# Patient Record
Sex: Male | Born: 1955 | Race: Asian | Hispanic: No | Marital: Married | Smoking: Current every day smoker
Health system: Southern US, Community
[De-identification: ages and names within clinical notes are randomized; demographics above are authoritative.]

## PROBLEM LIST (undated history)

## (undated) DIAGNOSIS — M1990 Unspecified osteoarthritis, unspecified site: Secondary | ICD-10-CM

## (undated) DIAGNOSIS — I10 Essential (primary) hypertension: Secondary | ICD-10-CM

## (undated) DIAGNOSIS — I739 Peripheral vascular disease, unspecified: Secondary | ICD-10-CM

---
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
## 2014-04-08 ENCOUNTER — Ambulatory Visit (INDEPENDENT_AMBULATORY_CARE_PROVIDER_SITE_OTHER): Payer: BC Managed Care – PPO | Admitting: Family

## 2014-04-08 ENCOUNTER — Ambulatory Visit (INDEPENDENT_AMBULATORY_CARE_PROVIDER_SITE_OTHER)
Admission: RE | Admit: 2014-04-08 | Discharge: 2014-04-08 | Disposition: A | Discharge status: Home / Self Care | Payer: BC Managed Care – PPO | Source: Ambulatory Visit | Attending: Family | Admitting: Family

## 2014-04-08 ENCOUNTER — Encounter: Payer: Self-pay | Admitting: Family

## 2014-04-08 ENCOUNTER — Telehealth: Payer: Self-pay | Admitting: Family

## 2014-04-08 VITALS — BP 170/94 | HR 104 | Temp 97.8°F | Resp 18 | Ht 63.0 in | Wt 105.0 lb

## 2014-04-08 DIAGNOSIS — M25551 Pain in right hip: Secondary | ICD-10-CM

## 2014-04-08 DIAGNOSIS — Z720 Tobacco use: Secondary | ICD-10-CM

## 2014-04-08 DIAGNOSIS — M1611 Unilateral primary osteoarthritis, right hip: Secondary | ICD-10-CM | POA: Insufficient documentation

## 2014-04-08 DIAGNOSIS — F17200 Nicotine dependence, unspecified, uncomplicated: Secondary | ICD-10-CM

## 2014-04-08 MED ORDER — METHYLPREDNISOLONE (PAK) 4 MG PO TABS: ORAL_TABLET | ORAL | Status: DC

## 2014-04-08 NOTE — Patient Instructions (Signed)
Thank you for choosing [facility].  Summary/Instructions:   Please stop by radiology for an x-ray of the right hip  Your medication has been sent to your pharmacy  If you do not experience relief with the steroid, please let us know.   Please make a follow up for 2 weeks for a blood pressure check.

## 2014-04-08 NOTE — Progress Notes (Signed)
Pre visit review using our clinic review tool, if applicable. No additional management support is needed unless otherwise documented below in the visit note. 

## 2014-04-08 NOTE — Assessment & Plan Note (Signed)
Currently smoking 1 pack per day and has no intentions of stopping now.

## 2014-04-08 NOTE — Telephone Encounter (Signed)
Please call patient to tell him that there were no fractures on his x-ray. However he does have significant osteoarthritic changes to the hip. I would like to send him to the orthopedic surgeons for further management. Please determine if he would like to go back to [facility].

## 2014-04-08 NOTE — Assessment & Plan Note (Signed)
Question if pain is related to sciatica. No obvious mechanism present. Obtain x-ray to rule out fracture despite denial of trauma. Start medrol dose pack to decrease inflammation. If symptoms do not improve with medrol dose pack will consider referral to orthopedics.

## 2014-04-08 NOTE — Progress Notes (Signed)
   Subjective:    Patient ID: Derek Dimmerhong T Rollins, male    DOB: August 08, 1955, 58 y.o.   MRN: [id number]  Chief Complaint  Patient presents with  . Establish Care    Has had hip issues x4 months, still in pain    HPI:  Derek Rollins is a 58 y.o. male who presents today to establish care and discuss a hip issue.   1) Hip pain - This is a chronic condition that started about 5 months ago. Quality of hip pain is described as sharp. Indicates everything is dependent upon his left leg and is currently walking with a cane. Denies any treatments that he has tried to make it better. Movement or when carrying something make it worse. When he massages his right hip it feels better. Denies trauma or injury to either hip.    2) Blood pressure - Hasn't been to a doctor in many years. Has not had any previously high blood pressure readings.   No Known Allergies  No current outpatient prescriptions on file prior to visit.   No current facility-administered medications on file prior to visit.   History reviewed. No pertinent past medical history.  Past Surgical History  Procedure Laterality Date  . Lumbar disc surgery      Review of Systems    See HPI Objective:    BP 170/94  Pulse 104  Temp(Src) 97.8 F (36.6 C) (Oral)  Resp 18  Ht 5\' 3"  (1.6 m)  Wt 105 lb (47.628 kg)  BMI 18.60 kg/m2  SpO2 96% Nursing note and vital signs reviewed.  Physical Exam  Constitutional: He is oriented to person, place, and time.  Thin male who walks with a cane. Appears older than his stated age, dressed appropriately in NAD.   Cardiovascular: Normal rate, regular rhythm and normal heart sounds.   Pulmonary/Chest: Effort normal and breath sounds normal.  Musculoskeletal:  No obvious deformity, discoloration or edema of right hip. Palpable tenderness over right greater trochanter and around ischial tuberosity. ROM is decreased in internal and external rotation. Able to flex without problem.   Neurological: He is  alert and oriented to person, place, and time.  Skin: Skin is warm and dry.  Psychiatric: He has a normal mood and affect. His behavior is normal. Judgment and thought content normal.      Assessment & Plan:

## 2014-04-09 ENCOUNTER — Telehealth: Payer: Self-pay | Admitting: Family

## 2014-04-09 NOTE — Telephone Encounter (Signed)
Sending results by mail

## 2014-04-09 NOTE — Telephone Encounter (Signed)
Tried calling pt to let him know results of xray. The number was disconnected.

## 2014-04-09 NOTE — Telephone Encounter (Signed)
emmi mailed  °

## 2014-04-22 ENCOUNTER — Other Ambulatory Visit (INDEPENDENT_AMBULATORY_CARE_PROVIDER_SITE_OTHER): Payer: BC Managed Care – PPO

## 2014-04-22 ENCOUNTER — Ambulatory Visit (INDEPENDENT_AMBULATORY_CARE_PROVIDER_SITE_OTHER): Payer: BC Managed Care – PPO | Admitting: Family

## 2014-04-22 ENCOUNTER — Encounter: Payer: Self-pay | Admitting: Family

## 2014-04-22 ENCOUNTER — Telehealth: Payer: Self-pay | Admitting: Family

## 2014-04-22 VITALS — BP 182/108 | HR 86 | Temp 97.1°F | Resp 18 | Ht 63.0 in | Wt 104.8 lb

## 2014-04-22 DIAGNOSIS — I10 Essential (primary) hypertension: Secondary | ICD-10-CM

## 2014-04-22 DIAGNOSIS — M1611 Unilateral primary osteoarthritis, right hip: Secondary | ICD-10-CM

## 2014-04-22 LAB — LIPID PANEL
Cholesterol: 166 mg/dL (ref 0–200)
HDL: 62.3 mg/dL (ref >39.00)
LDL CALC: 70 mg/dL (ref 0–99)
NONHDL: 103.7
Total CHOL/HDL Ratio: 3
Triglycerides: 168 mg/dL — ABNORMAL HIGH (ref 0.0–149.0)
VLDL: 33.6 mg/dL (ref 0.0–40.0)

## 2014-04-22 LAB — CBC
HEMATOCRIT: 49.5 % (ref 39.0–52.0)
HEMOGLOBIN: 16.3 g/dL (ref 13.0–17.0)
MCHC: 32.9 g/dL (ref 30.0–36.0)
MCV: 88.2 fl (ref 78.0–100.0)
Platelets: 255 10*3/uL (ref 150.0–400.0)
RBC: 5.61 Mil/uL (ref 4.22–5.81)
RDW: 13.5 % (ref 11.5–15.5)
WBC: 13.3 10*3/uL — ABNORMAL HIGH (ref 4.0–10.5)

## 2014-04-22 LAB — BASIC METABOLIC PANEL
BUN: 11 mg/dL (ref 6–23)
CHLORIDE: 99 meq/L (ref 96–112)
CO2: 27 meq/L (ref 19–32)
Calcium: 8.9 mg/dL (ref 8.4–10.5)
Creatinine, Ser: 0.8 mg/dL (ref 0.4–1.5)
GFR: 100.08 mL/min (ref >60.00)
Glucose, Bld: 83 mg/dL (ref 70–99)
POTASSIUM: 4.1 meq/L (ref 3.5–5.1)
Sodium: 133 mEq/L — ABNORMAL LOW (ref 135–145)

## 2014-04-22 MED ORDER — AMLODIPINE BESYLATE 5 MG PO TABS
5.0000 mg | ORAL_TABLET | Freq: Every day | ORAL | Status: DC
Start: 2014-04-22 — End: 2014-05-21

## 2014-04-22 MED ORDER — MELOXICAM 15 MG PO TABS: 15.0000 mg | ORAL_TABLET | Freq: Every day | ORAL | Status: DC

## 2014-04-22 NOTE — Assessment & Plan Note (Addendum)
Has had multiple high blood pressure readings above the goal of 140/90. Explained to patient need to start antihypertensive medications. Start amlodipine. Educated regarding the side effects of lower leg edema. Discussed importance of reducing and quitting smoking to assist with achieving this goal. Follow up in 1 month.

## 2014-04-22 NOTE — Progress Notes (Signed)
   Subjective:    Patient ID: Derek Rollins, male    DOB: Nov 01, 1955, 58 y.o.   MRN: [id number]  Chief Complaint  Patient presents with  . Follow-up    still having leg pain    HPI:  Derek Rollins is a 58 y.o. male who presents today for follow up of right hip pain. His translator Jamelle HaringSnow is present for the visit with his permission and for interpretation.  1) Osteoarthritis of Right Hip - Reviewed the results of his x-ray which showed osteoarthritic changes to the right hip. Was recently prescribed a medrol dose pack which he said helped the pain some but the pain has returned since stopping the medication. He continues to walk with a cane as needed.  2) Blood pressure - Continues to remain high follow multiple readings. Currently not taking any medication for BP control. Denies chest pain/discomfort, edema, shortness of breath or palpitations. Continues to smoke.   BP Readings from Last 3 Encounters:  04/22/14 182/108  04/08/14 170/94   No Known Allergies  Current Outpatient Prescriptions on File Prior to Visit  Medication Sig Dispense Refill  . methylPREDNIsolone (MEDROL DOSPACK) 4 MG tablet follow package directions 21 tablet 0   No current facility-administered medications on file prior to visit.   Review of Systems    See HPI  Objective:    BP 182/108 mmHg  Pulse 86  Temp(Src) 97.1 F (36.2 C) (Oral)  Resp 18  Ht 5\' 3"  (1.6 m)  Wt 104 lb 12.8 oz (47.537 kg)  BMI 18.57 kg/m2  SpO2 97% Nursing note and vital signs reviewed. Retake BP 160 / 92  Physical Exam  Constitutional: He is oriented to person, place, and time. He appears well-developed and well-nourished. No distress.  Thin male who appears older than his stated age, seated in the chair with cane next to him. Dressed appropriately for this situation.   Cardiovascular: Normal rate, regular rhythm, normal heart sounds and intact distal pulses.   Pulmonary/Chest: Effort normal and breath sounds normal.    Musculoskeletal:  Tenderness of greater trochanter and slightly inferior to it.   Neurological: He is alert and oriented to person, place, and time.  Skin: Skin is warm and dry.  Psychiatric: He has a normal mood and affect. His behavior is normal. Judgment and thought content normal.       Assessment & Plan:

## 2014-04-22 NOTE — Progress Notes (Signed)
Pre visit review using our clinic review tool, if applicable. No additional management support is needed unless otherwise documented below in the visit note. 

## 2014-04-22 NOTE — Patient Instructions (Addendum)
Thank you for choosing [facility].  Summary/Instructions:   Please stop by the lab for lab work.  Please start taking the amlodipine 1 pill daily for your blood pressure.  Please take the mobic 1 pill daily as needed for the hip pain.  Your referral has been made to orthopedics for management of your hip pain. You should hear back in about a week.

## 2014-04-22 NOTE — Assessment & Plan Note (Signed)
Medrol dose pack decreased pain and inflammation. Returned when medication stopped. Start Mobic as needed for pain. Instructed not to take any additional ibuprofen or Aleve. Refer to orthopedic surgery for potential injections and further management.

## 2014-04-22 NOTE — Telephone Encounter (Signed)
Please call the patient's translator, Jamelle HaringSnow to inform the patient that his lab work has come back mostly normal. Therefore no additional lab work is needed at this time. We will continue to work on bringing down his blood pressure and await referral to orthopedics.

## 2014-04-23 NOTE — Telephone Encounter (Signed)
Notified translator (snow) with Tammy SoursGreg response...Raechel Chute/lmb

## 2014-05-21 ENCOUNTER — Ambulatory Visit (INDEPENDENT_AMBULATORY_CARE_PROVIDER_SITE_OTHER): Payer: BC Managed Care – PPO | Admitting: Family

## 2014-05-21 ENCOUNTER — Encounter: Payer: Self-pay | Admitting: Family

## 2014-05-21 VITALS — BP 150/92 | HR 81 | Temp 98.1°F | Ht 63.0 in | Wt 110.0 lb

## 2014-05-21 DIAGNOSIS — I10 Essential (primary) hypertension: Secondary | ICD-10-CM

## 2014-05-21 DIAGNOSIS — M1611 Unilateral primary osteoarthritis, right hip: Secondary | ICD-10-CM

## 2014-05-21 MED ORDER — MELOXICAM 15 MG PO TABS: 15.0000 mg | ORAL_TABLET | Freq: Every day | ORAL | Status: DC

## 2014-05-21 MED ORDER — AMLODIPINE BESYLATE 5 MG PO TABS: 5.0000 mg | ORAL_TABLET | Freq: Every day | ORAL | Status: DC

## 2014-05-21 NOTE — Assessment & Plan Note (Signed)
Pressure remains stable on 5 mg of amlodipine. Slightly elevated today patient indicates he has not taken his blood pressure medication yet. Continue 5 mg amlodipine.

## 2014-05-21 NOTE — Assessment & Plan Note (Signed)
Patient seen by orthopedics. He is scheduled to have a right total hip replacements in the near future. Follow up as necessary.

## 2014-05-21 NOTE — Patient Instructions (Signed)
Thank you for choosing [facility].  Summary/Instructions:  Your prescription(s) have been submitted to your pharmacy. Please take as directed and contact our office if you believe you are having problem(s) with the medication(s).  Continue to take medications as prescribed.

## 2014-05-21 NOTE — Progress Notes (Signed)
   Subjective:    Patient ID: Derek Rollins, male    DOB: [birth year], 58 y.o.   MRN: [id number]  Chief Complaint  Patient presents with  . Follow-up    HPI:  Derek Rollins is a 58 y.o. male who presents today for follow up.  1) Hypertension - Was recently started on amlodipine. Denies chest pain/discomfort, shortness of breath, or edema.   BP Readings from Last 3 Encounters:  05/21/14 150/92  04/22/14 182/108  04/08/14 170/94   2) Right Hip - was recently seen [facility] and it was determined he would need a right total hip replacement. Continues to take the meloxicam which helps maintain the pain.  No Known Allergies  Current Outpatient Prescriptions  Medication Sig Dispense Refill  . amLODipine (NORVASC) 5 MG tablet Take 1 tablet (5 mg total) by mouth daily. 30 tablet 5  . meloxicam (MOBIC) 15 MG tablet Take 1 tablet (15 mg total) by mouth daily. 30 tablet 2  . methylPREDNIsolone (MEDROL DOSPACK) 4 MG tablet follow package directions 21 tablet 0   No current facility-administered medications for this visit.    Review of Systems   See HPI    Objective:    BP 150/92 mmHg  Pulse 81  Temp(Src) 98.1 F (36.7 C) (Oral)  Ht 5\' 3"  (1.6 m)  Wt 110 lb (49.896 kg)  BMI 19.49 kg/m2  SpO2 97% Nursing note and vital signs reviewed.  Physical Exam  Constitutional: He is oriented to person, place, and time. He appears well-developed and well-nourished. No distress.  Pleasant male seated in the chair dressed appropriately and appears slightly older than his stated age  Cardiovascular: Normal rate, regular rhythm, normal heart sounds and intact distal pulses.   Pulmonary/Chest: Effort normal and breath sounds normal.  Neurological: He is alert and oriented to person, place, and time.  Skin: Skin is warm and dry.  Psychiatric: He has a normal mood and affect. His behavior is normal. Judgment and thought content normal.       Assessment & Plan:

## 2014-05-24 ENCOUNTER — Telehealth: Payer: Self-pay | Admitting: Family

## 2014-05-24 NOTE — Telephone Encounter (Signed)
emmi mailed  °

## 2014-06-10 ENCOUNTER — Telehealth: Payer: Self-pay

## 2014-06-10 NOTE — Telephone Encounter (Signed)
Pts translator called asking about getting pt medical clearance for [facility]. I called the translator back and left a message for her to call me back. I also stated in the message that in order for pt to get the medical clearance, he would need to be seen by Tammy SoursGreg or any provider here.

## 2014-06-16 ENCOUNTER — Encounter: Payer: Self-pay | Admitting: Family

## 2014-06-16 ENCOUNTER — Ambulatory Visit (INDEPENDENT_AMBULATORY_CARE_PROVIDER_SITE_OTHER): Payer: Self-pay | Admitting: Family

## 2014-06-16 ENCOUNTER — Ambulatory Visit: Payer: Self-pay | Admitting: Family

## 2014-06-16 VITALS — BP 138/82 | HR 77 | Temp 98.0°F | Resp 18 | Wt 109.0 lb

## 2014-06-16 DIAGNOSIS — I10 Essential (primary) hypertension: Secondary | ICD-10-CM

## 2014-06-16 DIAGNOSIS — Z01818 Encounter for other preprocedural examination: Secondary | ICD-10-CM | POA: Insufficient documentation

## 2014-06-16 NOTE — Assessment & Plan Note (Signed)
Stable on 5 mg of amlodipine. Continue current dosage as prescribed.

## 2014-06-16 NOTE — Progress Notes (Signed)
Subjective:    Patient ID: Derek Rollins, male    DOB: 1955/08/03, 59 y.o.   MRN: [id number]   Chief Complaint  Patient presents with  . Surgical Clearance    Hip surgery, [facility] said they already faxed paperwork over for clearance    HPI:  [name] is a 59 y.o. male who presents today for an exam for surgical clearance.  Patient reports today at the request of [facility] to clear him for his upcoming right total hip replacement. X-rays revealed significant osteoarthritic changes which is the need for surgery.  Problem List  1) Hypertension - currently stable and maintained on amlodipine.   2) Right hip pain - currently maintained on meloxicam as needed for pain control  No Known Allergies   Current Outpatient Prescriptions on File Prior to Visit  Medication Sig Dispense Refill  . amLODipine (NORVASC) 5 MG tablet Take 1 tablet (5 mg total) by mouth daily. 30 tablet 5  . meloxicam (MOBIC) 15 MG tablet Take 1 tablet (15 mg total) by mouth daily. 30 tablet 2   No current facility-administered medications on file prior to visit.    No past medical history on file.  Past Surgical History  Procedure Laterality Date  . Lumbar disc surgery      No family history on file.   History   Social History  . Marital Status: Married    Spouse Name: N/A    Number of Children: 2  . Years of Education: 12   Occupational History  . Unemployed      Working on [employer] disability   Social History Main Topics  . Smoking status: Current Every Day Smoker -- 1.00 packs/day for 32 years    Types: Cigarettes  . Smokeless tobacco: Never Used  . Alcohol Use: Not on file     Comment: Occasional  . Drug Use: No  . Sexual Activity: Not on file   Other Topics Concern  . Not on file   Social History Narrative   Born and raised in [place]. Currently lives in a house - wife and children. No pets. Fun: Can't do much now - sing karoke.   Denies religious beliefs  effecting health care.     Review of Systems  Constitutional: Denies fever, chills, fatigue, or significant weight gain/loss. HENT: Head: Denies headache or neck pain Ears: Denies changes in hearing, ringing in ears, earache, drainage Nose: Denies discharge, stuffiness, itching, nosebleed, sinus pain Throat: Denies sore throat, hoarseness, dry mouth, sores, thrush Eyes: Denies loss/changes in vision, pain, redness, blurry/double vision, flashing lights Cardiovascular: Denies chest pain/discomfort, tightness, palpitations, shortness of breath with activity, difficulty lying down, swelling, sudden awakening with shortness of breath Respiratory: Denies shortness of breath, cough, sputum production, wheezing Gastrointestinal: Denies dysphasia, heartburn, change in appetite, nausea, change in bowel habits, rectal bleeding, constipation, diarrhea, yellow skin or eyes Genitourinary: Denies frequency, urgency, burning/pain, blood in urine, incontinence, change in urinary strength. Musculoskeletal: Denies muscle/joint pain (other than described below), stiffness, back pain, redness or swelling of joints, trauma Right hip pain secondary to osteoarthritic changes Skin: Denies rashes, lumps, itching, dryness, color changes, or hair/nail changes Neurological: Denies dizziness, fainting, seizures, weakness, numbness, tingling, tremor Psychiatric - Denies nervousness, stress, depression or memory loss Endocrine: Denies heat or cold intolerance, sweating, frequent urination, excessive thirst, changes in appetite Hematologic: Denies ease of bruising or bleeding      Objective:    BP 138/82 mmHg  Pulse 77  Temp(Src)  98 F (36.7 C) (Oral)  Resp 18  Wt 109 lb (49.442 kg)  SpO2 98% Nursing note and vital signs reviewed.  Physical Exam  Constitutional: He is oriented to person, place, and time. He appears well-developed and well-nourished.  HENT:  Head: Normocephalic.  Right Ear: Hearing, tympanic  membrane, external ear and ear canal normal.  Left Ear: Hearing, tympanic membrane, external ear and ear canal normal.  Nose: Nose normal.  Mouth/Throat: Uvula is midline, oropharynx is clear and moist and mucous membranes are normal. Abnormal dentition (Poor dentition).  Eyes: Conjunctivae and EOM are normal. Pupils are equal, round, and reactive to light.  Neck: Neck supple. No JVD present. No tracheal deviation present. No thyromegaly present.  Cardiovascular: Normal rate, regular rhythm, normal heart sounds and intact distal pulses.   EKG revealed normal sinus rhythm   Pulmonary/Chest: Effort normal and breath sounds normal.  Abdominal: Soft. Bowel sounds are normal. He exhibits no distension and no mass. There is no tenderness. There is no rebound and no guarding.  Musculoskeletal: Normal range of motion. He exhibits no edema.  Tenderness elicited greater trochanter of right hip.   Lymphadenopathy:    He has no cervical adenopathy.  Neurological: He is alert and oriented to person, place, and time. He has normal reflexes. No cranial nerve deficit. He exhibits normal muscle tone. Coordination normal.  Skin: Skin is warm and dry.  Psychiatric: He has a normal mood and affect. His behavior is normal. Judgment and thought content normal.      Assessment & Plan:

## 2014-06-16 NOTE — Patient Instructions (Addendum)
Thank you for choosing [facility].  Summary/Instructions:  You have been cleared for surgery.   Good luck with your surgery.

## 2014-06-16 NOTE — Assessment & Plan Note (Signed)
Patient assessed for pre-operative clearance for right total hip replacement. EKG revealed normal sinus rhythm. Based on history and physical exam, patient is cleared for total hip replacement surgery.

## 2014-06-21 ENCOUNTER — Other Ambulatory Visit: Payer: Self-pay | Admitting: Surgical

## 2014-06-23 ENCOUNTER — Other Ambulatory Visit (HOSPITAL_COMMUNITY): Payer: Self-pay | Admitting: *Deleted

## 2014-06-23 NOTE — Patient Instructions (Addendum)
Derek Rollins  06/23/2014   Your procedure is scheduled on: Monday 06/28/2014  [facility]  Entr[facility] and follow signs to               Short Stay Center at 1030 AM.  Call this number if you have problems the morning of surgery [phone]   Remember:  Do not eat food or drink liquids :After Midnight.     Take these medicines the morning of surgery with A SIP OF WATER: Amlodipine                               You may not have any metal on your body including hair pins and              piercings  Do not wear jewelry, make-up, lotions, powders or perfumes.             Do not wear nail polish.  Do not shave  48 hours prior to surgery.              Men may shave face and neck.   Do not bring valuables to the hospital. [name]   FOR VALUABLES.  Contacts, dentures or bridgework may not be worn into surgery.  Leave suitcase in the car. After surgery it may be brought to your room.     Patients discharged the day of surgery will not be allowed to drive home.  Name and phone number of your driver:  Special Instructions: N/A              Please read over the following fact sheets you were given: _____________________________________________________________________             [facility] - Preparing for Surgery Before surgery, you can play an important role.  Because skin is not sterile, your skin needs to be as free of germs as possible.  You can reduce the number of germs on your skin by washing with CHG (chlorahexidine gluconate) soap before surgery.  CHG is an antiseptic cleaner which kills germs and bonds with the skin to continue killing germs even after washing. Please DO NOT use if you have an allergy to CHG or antibacterial soaps.  If your skin becomes reddened/irritated stop using the CHG and inform your nurse when you arrive at Short Stay. Do not shave (including legs and underarms) for at least 48 hours  prior to the first CHG shower.  You may shave your face/neck. Please follow these instructions carefully:  1.  Shower with CHG Soap the night before surgery and the  morning of Surgery.  2.  If you choose to wash your hair, wash your hair first as usual with your  normal  shampoo.  3.  After you shampoo, rinse your hair and body thoroughly to remove the  shampoo.                           4.  Use CHG as you would any other liquid soap.  You can apply chg directly  to the skin and wash                       Gently  with a scrungie or clean washcloth.  5.  Apply the CHG Soap to your body ONLY FROM THE NECK DOWN.   Do not use on face/ open                           Wound or open sores. Avoid contact with eyes, ears mouth and genitals (private parts).                       Wash face,  Genitals (private parts) with your normal soap.             6.  Wash thoroughly, paying special attention to the area where your surgery  will be performed.  7.  Thoroughly rinse your body with warm water from the neck down.  8.  DO NOT shower/wash with your normal soap after using and rinsing off  the CHG Soap.                9.  Pat yourself dry with a clean towel.            10.  Wear clean pajamas.            11.  Place clean sheets on your bed the night of your first shower and do not  sleep with pets. Day of Surgery : Do not apply any lotions/deodorants the morning of surgery.  Please wear clean clothes to the hospital/surgery center.  FAILURE TO FOLLOW THESE INSTRUCTIONS MAY RESULT IN THE CANCELLATION OF YOUR SURGERY PATIENT SIGNATURE_________________________________  NURSE SIGNATURE__________________________________  ________________________________________________________________________   Derek Rollins  An incentive spirometer is a tool that can help keep your lungs clear and active. This tool measures how well you are filling your lungs with each breath. Taking long deep breaths may help reverse  or decrease the chance of developing breathing (pulmonary) problems (especially infection) following:  A long period of time when you are unable to move or be active. BEFORE THE PROCEDURE   If the spirometer includes an indicator to show your best effort, your nurse or respiratory therapist will set it to a desired goal.  If possible, sit up straight or lean slightly forward. Try not to slouch.  Hold the incentive spirometer in an upright position. INSTRUCTIONS FOR USE   Sit on the edge of your bed if possible, or sit up as far as you can in bed or on a chair.  Hold the incentive spirometer in an upright position.  Breathe out normally.  Place the mouthpiece in your mouth and seal your lips tightly around it.  Breathe in slowly and as deeply as possible, raising the piston or the ball toward the top of the column.  Hold your breath for 3-5 seconds or for as long as possible. Allow the piston or ball to fall to the bottom of the column.  Remove the mouthpiece from your mouth and breathe out normally.  Rest for a few seconds and repeat Steps 1 through 7 at least 10 times every 1-2 hours when you are awake. Take your time and take a few normal breaths between deep breaths.  The spirometer may include an indicator to show your best effort. Use the indicator as a goal to work toward during each repetition.  After each set of 10 deep breaths, practice coughing to be sure your lungs are clear. If you have an incision (the cut made at the time of surgery), support  your incision when coughing by placing a pillow or rolled up towels firmly against it. Once you are able to get out of bed, walk around indoors and cough well. You may stop using the incentive spirometer when instructed by your caregiver.  RISKS AND COMPLICATIONS  Take your time so you do not get dizzy or light-headed.  If you are in pain, you may need to take or ask for pain medication before doing incentive spirometry. It is  harder to take a deep breath if you are having pain. AFTER USE  Rest and breathe slowly and easily.  It can be helpful to keep track of a log of your progress. Your caregiver can provide you with a simple table to help with this. If you are using the spirometer at home, follow these instructions: [place] IF:   You are having difficultly using the spirometer.  You have trouble using the spirometer as often as instructed.  Your pain medication is not giving enough relief while using the spirometer.  You develop fever of 100.5 F (38.1 C) or higher. SEEK IMMEDIATE MEDICAL CARE IF:   You cough up bloody sputum that had not been present before.  You develop fever of 102 F (38.9 C) or greater.  You develop worsening pain at or near the incision site. MAKE SURE YOU:   Understand these instructions.  Will watch your condition.  Will get help right away if you are not doing well or get worse. Document Released: 10/08/2006 Document Revised: 08/20/2011 Document Reviewed: 12/09/2006 Exit[employer] Patient Information 2014 Exit[employer], [place].   ________________________________________________________________________  WHAT IS A BLOOD TRANSFUSION? Blood Transfusion Information  A transfusion is the replacement of blood or some of its parts. Blood is made up of multiple cells which provide different functions.  Red blood cells carry oxygen and are used for blood loss replacement.  White blood cells fight against infection.  Platelets control bleeding.  Plasma helps clot blood.  Other blood products are available for specialized needs, such as hemophilia or other clotting disorders. BEFORE THE TRANSFUSION  Who gives blood for transfusions?   Healthy volunteers who are fully evaluated to make sure their blood is safe. This is blood bank blood. Transfusion therapy is the safest it has ever been in the practice of medicine. Before blood is taken from a donor, a complete history  is taken to make sure that person has no history of diseases nor engages in risky social behavior (examples are intravenous drug use or sexual activity with multiple partners). The donor's travel history is screened to minimize risk of transmitting infections, such as malaria. The donated blood is tested for signs of infectious diseases, such as HIV and hepatitis. The blood is then tested to be sure it is compatible with you in order to minimize the chance of a transfusion reaction. If you or a relative donates blood, this is often done in anticipation of surgery and is not appropriate for emergency situations. It takes many days to process the donated blood. RISKS AND COMPLICATIONS Although transfusion therapy is very safe and saves many lives, the main dangers of transfusion include:   Getting an infectious disease.  Developing a transfusion reaction. This is an allergic reaction to something in the blood you were given. Every precaution is taken to prevent this. The decision to have a blood transfusion has been considered carefully by your caregiver before blood is given. Blood is not given unless the benefits outweigh the risks. AFTER THE TRANSFUSION  Right after receiving a blood transfusion, you will usually feel much better and more energetic. This is especially true if your red blood cells have gotten low (anemic). The transfusion raises the level of the red blood cells which carry oxygen, and this usually causes an energy increase.  The nurse administering the transfusion will monitor you carefully for complications. HOME CARE INSTRUCTIONS  No special instructions are needed after a transfusion. You may find your energy is better. Speak with your caregiver about any limitations on activity for underlying diseases you may have. SEEK MEDICAL CARE IF:   Your condition is not improving after your transfusion.  You develop redness or irritation at the intravenous (IV) site. SEEK IMMEDIATE  MEDICAL CARE IF:  Any of the following symptoms occur over the next 12 hours:  Shaking chills.  You have a temperature by mouth above 102 F (38.9 C), not controlled by medicine.  Chest, back, or muscle pain.  People around you feel you are not acting correctly or are confused.  Shortness of breath or difficulty breathing.  Dizziness and fainting.  You get a rash or develop hives.  You have a decrease in urine output.  Your urine turns a dark color or changes to pink, red, or brown. Any of the following symptoms occur over the next 10 days:  You have a temperature by mouth above 102 F (38.9 C), not controlled by medicine.  Shortness of breath.  Weakness after normal activity.  The white part of the eye turns yellow (jaundice).  You have a decrease in the amount of urine or are urinating less often.  Your urine turns a dark color or changes to pink, red, or brown. Document Released: 05/25/2000 Document Revised: 08/20/2011 Document Reviewed: 01/12/2008 [facility] Patient Information 2014 [place], [place].  _______________________________________________________________________

## 2014-06-24 ENCOUNTER — Ambulatory Visit (HOSPITAL_COMMUNITY)
Admission: RE | Admit: 2014-06-24 | Discharge: 2014-06-24 | Disposition: A | Discharge status: Home / Self Care | Payer: BLUE CROSS/BLUE SHIELD | Source: Ambulatory Visit | Attending: Surgical | Admitting: Surgical

## 2014-06-24 ENCOUNTER — Encounter (HOSPITAL_COMMUNITY)
Admission: RE | Admit: 2014-06-24 | Discharge: 2014-06-24 | Disposition: A | Discharge status: Home / Self Care | Payer: BLUE CROSS/BLUE SHIELD | Source: Ambulatory Visit | Attending: Orthopedic Surgery | Admitting: Orthopedic Surgery

## 2014-06-24 ENCOUNTER — Encounter (HOSPITAL_COMMUNITY): Payer: Self-pay

## 2014-06-24 DIAGNOSIS — R918 Other nonspecific abnormal finding of lung field: Secondary | ICD-10-CM | POA: Insufficient documentation

## 2014-06-24 DIAGNOSIS — Z01818 Encounter for other preprocedural examination: Secondary | ICD-10-CM | POA: Insufficient documentation

## 2014-06-24 DIAGNOSIS — F17210 Nicotine dependence, cigarettes, uncomplicated: Secondary | ICD-10-CM | POA: Insufficient documentation

## 2014-06-24 DIAGNOSIS — I10 Essential (primary) hypertension: Secondary | ICD-10-CM | POA: Diagnosis not present

## 2014-06-24 HISTORY — DX: Unspecified osteoarthritis, unspecified site: M19.90

## 2014-06-24 HISTORY — DX: Essential (primary) hypertension: I10

## 2014-06-24 LAB — CBC WITH DIFFERENTIAL/PLATELET
Basophils Absolute: 0 10*3/uL (ref 0.0–0.1)
Basophils Relative: 0 % (ref 0–1)
Eosinophils Absolute: 0.2 10*3/uL (ref 0.0–0.7)
Eosinophils Relative: 2 % (ref 0–5)
HCT: 43 % (ref 39.0–52.0)
Hemoglobin: 14.4 g/dL (ref 13.0–17.0)
Lymphocytes Relative: 35 % (ref 12–46)
Lymphs Abs: 2.9 10*3/uL (ref 0.7–4.0)
MCH: 29.3 pg (ref 26.0–34.0)
MCHC: 33.5 g/dL (ref 30.0–36.0)
MCV: 87.6 fL (ref 78.0–100.0)
Monocytes Absolute: 0.7 10*3/uL (ref 0.1–1.0)
Monocytes Relative: 8 % (ref 3–12)
Neutro Abs: 4.5 10*3/uL (ref 1.7–7.7)
Neutrophils Relative %: 55 % (ref 43–77)
Platelets: 287 10*3/uL (ref 150–400)
RBC: 4.91 MIL/uL (ref 4.22–5.81)
RDW: 13.2 % (ref 11.5–15.5)
WBC: 8.3 10*3/uL (ref 4.0–10.5)

## 2014-06-24 LAB — URINALYSIS, ROUTINE W REFLEX MICROSCOPIC
Bilirubin Urine: NEGATIVE
Glucose, UA: NEGATIVE mg/dL
Hgb urine dipstick: NEGATIVE
Ketones, ur: NEGATIVE mg/dL
Leukocytes, UA: NEGATIVE
Nitrite: NEGATIVE
Protein, ur: NEGATIVE mg/dL
Specific Gravity, Urine: 1.005 (ref 1.005–1.030)
Urobilinogen, UA: 0.2 mg/dL (ref 0.0–1.0)
pH: 7 (ref 5.0–8.0)

## 2014-06-24 LAB — COMPREHENSIVE METABOLIC PANEL
ALT: 30 U/L (ref 0–53)
AST: 24 U/L (ref 0–37)
Albumin: 4.3 g/dL (ref 3.5–5.2)
Alkaline Phosphatase: 61 U/L (ref 39–117)
Anion gap: 7 (ref 5–15)
BUN: 9 mg/dL (ref 6–23)
CO2: 26 mmol/L (ref 19–32)
Calcium: 9.1 mg/dL (ref 8.4–10.5)
Chloride: 108 mEq/L (ref 96–112)
Creatinine, Ser: 0.69 mg/dL (ref 0.50–1.35)
GFR calc Af Amer: >90 mL/min (ref >90)
GFR calc non Af Amer: >90 mL/min (ref >90)
Glucose, Bld: 87 mg/dL (ref 70–99)
Potassium: 3.9 mmol/L (ref 3.5–5.1)
Sodium: 141 mmol/L (ref 135–145)
Total Bilirubin: 0.7 mg/dL (ref 0.3–1.2)
Total Protein: 7.2 g/dL (ref 6.0–8.3)

## 2014-06-24 LAB — ABO/RH: ABO/RH(D): B POS

## 2014-06-24 LAB — APTT: aPTT: 31 seconds (ref 24–37)

## 2014-06-24 LAB — SURGICAL PCR SCREEN
MRSA, PCR: NEGATIVE
STAPHYLOCOCCUS AUREUS: NEGATIVE

## 2014-06-24 LAB — PROTIME-INR
INR: 0.9 (ref 0.00–1.49)
Prothrombin Time: 12.3 seconds (ref 11.6–15.2)

## 2014-06-24 NOTE — H&P (Signed)
TOTAL HIP ADMISSION H&P  Patient is admitted for right total hip arthroplasty.  Subjective:  Chief Complaint: right hip pain  HPI: Derek Rollins, 59 y.o. male, has a history of pain and functional disability in the right hip(s) due to arthritis and patient has failed non-surgical conservative treatments for greater than 12 weeks to include NSAID's and/or analgesics, flexibility and strengthening excercises and activity modification.  Onset of symptoms was gradual starting 1 year ago with gradually worsening course since that time.The patient noted no past surgery on the right hip(s).  Patient currently rates pain in the right hip at 8 out of 10 with activity. Patient has night pain, worsening of pain with activity and weight bearing, pain that interfers with activities of daily living and pain with passive range of motion. Patient has evidence of periarticular osteophytes and joint space narrowing by imaging studies. There is no current active infection.  Patient Active Problem List   Diagnosis Date Noted  . Pre-operative clearance 06/16/2014  . Essential hypertension 04/22/2014  . Osteoarthritis of right hip 04/08/2014  . Tobacco use disorder 04/08/2014    Past Medical History Hypertension Ostearthritis  Past Surgical History  Procedure Laterality Date  . Lumbar disc surgery       Current outpatient prescriptions:  .  amLODipine (NORVASC) 5 MG tablet, Take 1 tablet (5 mg total) by mouth daily., Disp: 30 tablet, Rfl: 5 .  meloxicam (MOBIC) 15 MG tablet, Take 1 tablet (15 mg total) by mouth daily., Disp: 30 tablet, Rfl: 2  No Known Allergies  History  Substance Use Topics  . Smoking status: Current Every Day Smoker -- 1.00 packs/day for 32 years    Types: Cigarettes  . Smokeless tobacco: Never Used  . Alcohol Use: 1-2 drinks per week "at most"     Comment: Occasional      Review of Systems  Constitutional: Negative.   HENT: Negative.   Eyes: Negative.   Respiratory:  Negative.   Cardiovascular: Negative.   Gastrointestinal: Negative.   Genitourinary: Negative.   Musculoskeletal: Positive for joint pain. Negative for myalgias, back pain, falls and neck pain.       Right hip pain  Skin: Negative.   Neurological: Negative.   Endo/Heme/Allergies: Negative.   Psychiatric/Behavioral: Negative.     Objective:  Physical Exam  Constitutional: He is oriented to person, place, and time. He appears well-developed. No distress.  Underweight  HENT:  Head: Normocephalic and atraumatic.  Right Ear: External ear normal.  Left Ear: External ear normal.  Nose: Nose normal.  Mouth/Throat: Oropharynx is clear and moist.  Eyes: Conjunctivae and EOM are normal.  Neck: Normal range of motion. Neck supple.  Cardiovascular: Normal rate, regular rhythm, normal heart sounds and intact distal pulses.   No murmur heard. Respiratory: Effort normal and breath sounds normal. No respiratory distress. He has no wheezes.  GI: Soft. Bowel sounds are normal. He exhibits no distension. There is no tenderness.  Musculoskeletal:       Right hip: He exhibits decreased range of motion, decreased strength and crepitus.       Left hip: Normal.       Right knee: Normal.       Left knee: Normal.       Right lower leg: He exhibits no tenderness and no swelling.       Left lower leg: He exhibits no tenderness and no swelling.  He has about a 3/4-inch leg-length discrepancy. The right leg is shorter than the  left leg  Neurological: He is alert and oriented to person, place, and time. He has normal strength and normal reflexes. No sensory deficit.  Skin: No rash noted. He is not diaphoretic. No erythema.  Psychiatric: He has a normal mood and affect. His behavior is normal.   Vitals  Weight: 110 lb Height: 63.5in Body Surface Area: 1.51 m Body Mass Index: 19.18 kg/m  Pulse: 88 (Regular)  BP: 146/83 (Sitting, Left Arm, Standard)  Imaging Review Plain radiographs  demonstrate severe degenerative joint disease of the right hip(s). The bone quality appears to be good for age and reported activity level.  Assessment/Plan:  End stage arthritis, right hip(s)  The patient history, physical examination, clinical judgement of the provider and imaging studies are consistent with end stage degenerative joint disease of the right hip(s) and total hip arthroplasty is deemed medically necessary. The treatment options including medical management, injection therapy, arthroscopy and arthroplasty were discussed at length. The risks and benefits of total hip arthroplasty were presented and reviewed. The risks due to aseptic loosening, infection, stiffness, dislocation/subluxation,  thromboembolic complications and other imponderables were discussed.  The patient acknowledged the explanation, agreed to proceed with the plan and consent was signed. Patient is being admitted for inpatient treatment for surgery, pain control, PT, OT, prophylactic antibiotics, VTE prophylaxis, progressive ambulation and ADL's and discharge planning.The patient is planning to be discharged home with home health services   PCP: Dr. Marga Melnick TXA IV Will have translator present with him   Dimitri Ped, PA-C

## 2014-06-24 NOTE — Progress Notes (Addendum)
06/16/2014-Pre-operative clearance from Marcos EkeGreg Calone, FNP on chart. Surgical clearance office [facility] Calone[facility]/2014 [facility].

## 2014-06-28 ENCOUNTER — Inpatient Hospital Stay (HOSPITAL_COMMUNITY): Payer: BLUE CROSS/BLUE SHIELD | Admitting: Anesthesiology

## 2014-06-28 ENCOUNTER — Encounter (HOSPITAL_COMMUNITY): Payer: Self-pay | Admitting: *Deleted

## 2014-06-28 ENCOUNTER — Inpatient Hospital Stay (HOSPITAL_COMMUNITY): Payer: BLUE CROSS/BLUE SHIELD

## 2014-06-28 ENCOUNTER — Encounter (HOSPITAL_COMMUNITY)
Admission: RE | Disposition: A | Discharge status: Home Health | Payer: Self-pay | Source: Ambulatory Visit | Attending: Orthopedic Surgery

## 2014-06-28 ENCOUNTER — Inpatient Hospital Stay (HOSPITAL_COMMUNITY)
Admission: RE | Admit: 2014-06-28 | Discharge: 2014-06-30 | DRG: 470 | Disposition: A | Discharge status: Home Health | Payer: BLUE CROSS/BLUE SHIELD | Source: Ambulatory Visit | Attending: Orthopedic Surgery | Admitting: Orthopedic Surgery

## 2014-06-28 DIAGNOSIS — F17210 Nicotine dependence, cigarettes, uncomplicated: Secondary | ICD-10-CM | POA: Diagnosis present

## 2014-06-28 DIAGNOSIS — M25551 Pain in right hip: Secondary | ICD-10-CM | POA: Diagnosis present

## 2014-06-28 DIAGNOSIS — Z681 Body mass index (BMI) 19 or less, adult: Secondary | ICD-10-CM

## 2014-06-28 DIAGNOSIS — R636 Underweight: Secondary | ICD-10-CM | POA: Diagnosis present

## 2014-06-28 DIAGNOSIS — Z79899 Other long term (current) drug therapy: Secondary | ICD-10-CM

## 2014-06-28 DIAGNOSIS — Z09 Encounter for follow-up examination after completed treatment for conditions other than malignant neoplasm: Secondary | ICD-10-CM

## 2014-06-28 DIAGNOSIS — Z96649 Presence of unspecified artificial hip joint: Secondary | ICD-10-CM

## 2014-06-28 DIAGNOSIS — I10 Essential (primary) hypertension: Secondary | ICD-10-CM | POA: Diagnosis present

## 2014-06-28 DIAGNOSIS — Z96641 Presence of right artificial hip joint: Secondary | ICD-10-CM

## 2014-06-28 DIAGNOSIS — M1611 Unilateral primary osteoarthritis, right hip: Principal | ICD-10-CM | POA: Diagnosis present

## 2014-06-28 HISTORY — PX: TOTAL HIP ARTHROPLASTY: SHX124

## 2014-06-28 LAB — TYPE AND SCREEN
ABO/RH(D): B POS
Antibody Screen: NEGATIVE

## 2014-06-28 SURGERY — ARTHROPLASTY, HIP, TOTAL,POSTERIOR APPROACH
Anesthesia: General | Site: Hip | Laterality: Right

## 2014-06-28 MED ORDER — PROPOFOL 10 MG/ML IV BOLUS
INTRAVENOUS | Status: AC
  Filled 2014-06-28: qty 20

## 2014-06-28 MED ORDER — POLYETHYLENE GLYCOL 3350 17 G PO PACK: 17.0000 g | PACK | Freq: Every day | ORAL | Status: DC | PRN

## 2014-06-28 MED ORDER — FENTANYL CITRATE 0.05 MG/ML IJ SOLN
25.0000 ug | INTRAMUSCULAR | Status: DC | PRN
  Administered 2014-06-28 (×4): 25 ug via INTRAVENOUS

## 2014-06-28 MED ORDER — NICOTINE 21 MG/24HR TD PT24
21.0000 mg | MEDICATED_PATCH | Freq: Every day | TRANSDERMAL | Status: DC
  Administered 2014-06-28 – 2014-06-30 (×3): 21 mg via TRANSDERMAL
  Filled 2014-06-28 (×3): qty 1

## 2014-06-28 MED ORDER — SODIUM CHLORIDE 0.9 % IV SOLN
1000.0000 mg | INTRAVENOUS | Status: AC
  Administered 2014-06-28: 13:00:00 via INTRAVENOUS
  Administered 2014-06-28: 1000 mg via INTRAVENOUS
  Filled 2014-06-28: qty 10

## 2014-06-28 MED ORDER — AMLODIPINE BESYLATE 5 MG PO TABS
5.0000 mg | ORAL_TABLET | Freq: Every day | ORAL | Status: DC
  Administered 2014-06-28 – 2014-06-30 (×2): 5 mg via ORAL
  Filled 2014-06-28 (×3): qty 1

## 2014-06-28 MED ORDER — PHENOL 1.4 % MT LIQD
1.0000 | OROMUCOSAL | Status: DC | PRN
  Filled 2014-06-28: qty 177

## 2014-06-28 MED ORDER — SODIUM CHLORIDE 0.9 % IJ SOLN
INTRAMUSCULAR | Status: DC | PRN
  Administered 2014-06-28: 20 mL

## 2014-06-28 MED ORDER — CEFAZOLIN SODIUM 1-5 GM-% IV SOLN
1.0000 g | Freq: Four times a day (QID) | INTRAVENOUS | Status: AC
  Administered 2014-06-28 (×2): 1 g via INTRAVENOUS
  Filled 2014-06-28 (×2): qty 50

## 2014-06-28 MED ORDER — CHLORHEXIDINE GLUCONATE 4 % EX LIQD: 60.0000 mL | Freq: Once | CUTANEOUS | Status: DC

## 2014-06-28 MED ORDER — HYDROMORPHONE HCL 1 MG/ML IJ SOLN
1.0000 mg | INTRAMUSCULAR | Status: DC | PRN
  Administered 2014-06-28: 1 mg via INTRAVENOUS
  Filled 2014-06-28: qty 1

## 2014-06-28 MED ORDER — FLEET ENEMA 7-19 GM/118ML RE ENEM: 1.0000 | ENEMA | Freq: Once | RECTAL | Status: AC | PRN

## 2014-06-28 MED ORDER — MIDAZOLAM HCL 5 MG/5ML IJ SOLN
INTRAMUSCULAR | Status: DC | PRN
  Administered 2014-06-28 (×2): 1 mg via INTRAVENOUS

## 2014-06-28 MED ORDER — CEFAZOLIN SODIUM-DEXTROSE 2-3 GM-% IV SOLR
2.0000 g | INTRAVENOUS | Status: AC
  Administered 2014-06-28: 2 g via INTRAVENOUS

## 2014-06-28 MED ORDER — METHOCARBAMOL 500 MG PO TABS
500.0000 mg | ORAL_TABLET | Freq: Four times a day (QID) | ORAL | Status: DC | PRN
Start: 2014-06-28 — End: 2014-06-30
  Administered 2014-06-28 – 2014-06-30 (×5): 500 mg via ORAL
  Filled 2014-06-28 (×5): qty 1

## 2014-06-28 MED ORDER — LACTATED RINGERS IV SOLN
INTRAVENOUS | Status: DC
  Administered 2014-06-28 (×3): via INTRAVENOUS

## 2014-06-28 MED ORDER — FENTANYL CITRATE 0.05 MG/ML IJ SOLN
INTRAMUSCULAR | Status: AC
  Filled 2014-06-28: qty 2

## 2014-06-28 MED ORDER — SODIUM CHLORIDE 0.9 % IR SOLN
Status: DC | PRN
  Administered 2014-06-28: 500 mL

## 2014-06-28 MED ORDER — MENTHOL 3 MG MT LOZG
1.0000 | LOZENGE | OROMUCOSAL | Status: DC | PRN
Start: 2014-06-28 — End: 2014-06-30
  Filled 2014-06-28: qty 9

## 2014-06-28 MED ORDER — FENTANYL CITRATE 0.05 MG/ML IJ SOLN
INTRAMUSCULAR | Status: AC
  Filled 2014-06-28: qty 5

## 2014-06-28 MED ORDER — RIVAROXABAN 10 MG PO TABS
10.0000 mg | ORAL_TABLET | Freq: Every day | ORAL | Status: DC
  Administered 2014-06-29 – 2014-06-30 (×2): 10 mg via ORAL
  Filled 2014-06-28 (×3): qty 1

## 2014-06-28 MED ORDER — BUPIVACAINE LIPOSOME 1.3 % IJ SUSP
20.0000 mL | Freq: Once | INTRAMUSCULAR | Status: AC
  Administered 2014-06-28: 20 mL
  Filled 2014-06-28: qty 20

## 2014-06-28 MED ORDER — FENTANYL CITRATE 0.05 MG/ML IJ SOLN: 25.0000 ug | INTRAMUSCULAR | Status: DC | PRN

## 2014-06-28 MED ORDER — LACTATED RINGERS IV SOLN
INTRAVENOUS | Status: DC
  Administered 2014-06-28: 100 mL/h via INTRAVENOUS
  Administered 2014-06-29: 02:00:00 via INTRAVENOUS

## 2014-06-28 MED ORDER — ONDANSETRON HCL 4 MG PO TABS: 4.0000 mg | ORAL_TABLET | Freq: Four times a day (QID) | ORAL | Status: DC | PRN

## 2014-06-28 MED ORDER — HYDROMORPHONE HCL 1 MG/ML IJ SOLN
INTRAMUSCULAR | Status: DC | PRN
  Administered 2014-06-28 (×2): 1 mg via INTRAVENOUS

## 2014-06-28 MED ORDER — LIDOCAINE HCL (CARDIAC) 20 MG/ML IV SOLN
INTRAVENOUS | Status: AC
  Filled 2014-06-28: qty 5

## 2014-06-28 MED ORDER — ONDANSETRON HCL 4 MG/2ML IJ SOLN
INTRAMUSCULAR | Status: DC | PRN
Start: 2014-06-28 — End: 2014-06-28
  Administered 2014-06-28: 4 mg via INTRAVENOUS

## 2014-06-28 MED ORDER — HYDROCODONE-ACETAMINOPHEN 5-325 MG PO TABS
1.0000 | ORAL_TABLET | ORAL | Status: DC | PRN
  Administered 2014-06-29 – 2014-06-30 (×2): 2 via ORAL
  Filled 2014-06-28 (×2): qty 2

## 2014-06-28 MED ORDER — PROMETHAZINE HCL 25 MG/ML IJ SOLN: 6.2500 mg | INTRAMUSCULAR | Status: DC | PRN

## 2014-06-28 MED ORDER — ACETAMINOPHEN 325 MG PO TABS: 650.0000 mg | ORAL_TABLET | Freq: Four times a day (QID) | ORAL | Status: DC | PRN

## 2014-06-28 MED ORDER — THROMBIN 5000 UNITS EX SOLR
CUTANEOUS | Status: DC | PRN
  Administered 2014-06-28: 5000 [IU] via TOPICAL

## 2014-06-28 MED ORDER — ROCURONIUM BROMIDE 100 MG/10ML IV SOLN
INTRAVENOUS | Status: DC | PRN
  Administered 2014-06-28: 40 mg via INTRAVENOUS

## 2014-06-28 MED ORDER — SODIUM CHLORIDE 0.9 % IR SOLN
Status: AC
  Filled 2014-06-28: qty 1

## 2014-06-28 MED ORDER — HYDROMORPHONE HCL 1 MG/ML IJ SOLN: 0.2500 mg | INTRAMUSCULAR | Status: DC | PRN

## 2014-06-28 MED ORDER — LIDOCAINE HCL (CARDIAC) 20 MG/ML IV SOLN
INTRAVENOUS | Status: DC | PRN
  Administered 2014-06-28: 50 mg via INTRAVENOUS

## 2014-06-28 MED ORDER — FENTANYL CITRATE 0.05 MG/ML IJ SOLN
INTRAMUSCULAR | Status: DC | PRN
  Administered 2014-06-28 (×3): 50 ug via INTRAVENOUS
  Administered 2014-06-28: 100 ug via INTRAVENOUS

## 2014-06-28 MED ORDER — ROCURONIUM BROMIDE 100 MG/10ML IV SOLN
INTRAVENOUS | Status: AC
Start: 2014-06-28 — End: 2014-06-28
  Filled 2014-06-28: qty 1

## 2014-06-28 MED ORDER — ONDANSETRON HCL 4 MG/2ML IJ SOLN
INTRAMUSCULAR | Status: AC
  Filled 2014-06-28: qty 2

## 2014-06-28 MED ORDER — CELECOXIB 200 MG PO CAPS
200.0000 mg | ORAL_CAPSULE | Freq: Two times a day (BID) | ORAL | Status: DC
  Administered 2014-06-28 – 2014-06-30 (×4): 200 mg via ORAL
  Filled 2014-06-28 (×5): qty 1

## 2014-06-28 MED ORDER — ACETAMINOPHEN 650 MG RE SUPP
650.0000 mg | Freq: Four times a day (QID) | RECTAL | Status: DC | PRN
Start: 2014-06-28 — End: 2014-06-30

## 2014-06-28 MED ORDER — STERILE WATER FOR IRRIGATION IR SOLN
Status: DC | PRN
  Administered 2014-06-28: 1500 mL

## 2014-06-28 MED ORDER — 0.9 % SODIUM CHLORIDE (POUR BTL) OPTIME
TOPICAL | Status: DC | PRN
  Administered 2014-06-28: 1000 mL

## 2014-06-28 MED ORDER — BUPIVACAINE HCL (PF) 0.25 % IJ SOLN
INTRAMUSCULAR | Status: AC
  Filled 2014-06-28: qty 30

## 2014-06-28 MED ORDER — BISACODYL 5 MG PO TBEC: 5.0000 mg | DELAYED_RELEASE_TABLET | Freq: Every day | ORAL | Status: DC | PRN

## 2014-06-28 MED ORDER — BUPIVACAINE-EPINEPHRINE (PF) 0.25% -1:200000 IJ SOLN
INTRAMUSCULAR | Status: AC
Start: 2014-06-28 — End: 2014-06-28
  Filled 2014-06-28: qty 30

## 2014-06-28 MED ORDER — FERROUS SULFATE 325 (65 FE) MG PO TABS
325.0000 mg | ORAL_TABLET | Freq: Three times a day (TID) | ORAL | Status: DC
  Administered 2014-06-29 – 2014-06-30 (×2): 325 mg via ORAL
  Filled 2014-06-28 (×7): qty 1

## 2014-06-28 MED ORDER — NEOSTIGMINE METHYLSULFATE 10 MG/10ML IV SOLN
INTRAVENOUS | Status: DC | PRN
  Administered 2014-06-28: 3 mg via INTRAVENOUS

## 2014-06-28 MED ORDER — SODIUM CHLORIDE 0.9 % IJ SOLN
INTRAMUSCULAR | Status: AC
  Filled 2014-06-28: qty 20

## 2014-06-28 MED ORDER — HYDRALAZINE HCL 20 MG/ML IJ SOLN
INTRAMUSCULAR | Status: AC
  Filled 2014-06-28: qty 1

## 2014-06-28 MED ORDER — MIDAZOLAM HCL 2 MG/2ML IJ SOLN
INTRAMUSCULAR | Status: AC
  Filled 2014-06-28: qty 2

## 2014-06-28 MED ORDER — ONDANSETRON HCL 4 MG/2ML IJ SOLN: 4.0000 mg | Freq: Four times a day (QID) | INTRAMUSCULAR | Status: DC | PRN

## 2014-06-28 MED ORDER — DEXAMETHASONE SODIUM PHOSPHATE 10 MG/ML IJ SOLN
INTRAMUSCULAR | Status: DC | PRN
  Administered 2014-06-28: 10 mg via INTRAVENOUS

## 2014-06-28 MED ORDER — ALUM & MAG HYDROXIDE-SIMETH 200-200-20 MG/5ML PO SUSP: 30.0000 mL | ORAL | Status: DC | PRN

## 2014-06-28 MED ORDER — GLYCOPYRROLATE 0.2 MG/ML IJ SOLN
INTRAMUSCULAR | Status: DC | PRN
  Administered 2014-06-28: 0.4 mg via INTRAVENOUS

## 2014-06-28 MED ORDER — OXYCODONE-ACETAMINOPHEN 5-325 MG PO TABS
2.0000 | ORAL_TABLET | ORAL | Status: DC | PRN
  Administered 2014-06-28 (×2): 1 via ORAL
  Administered 2014-06-29 (×2): 2 via ORAL
  Administered 2014-06-29: 1 via ORAL
  Administered 2014-06-29 – 2014-06-30 (×2): 2 via ORAL
  Filled 2014-06-28 (×7): qty 2

## 2014-06-28 MED ORDER — HYDROMORPHONE HCL 2 MG/ML IJ SOLN
INTRAMUSCULAR | Status: AC
  Filled 2014-06-28: qty 1

## 2014-06-28 MED ORDER — PROPOFOL 10 MG/ML IV BOLUS
INTRAVENOUS | Status: DC | PRN
  Administered 2014-06-28: 160 mg via INTRAVENOUS

## 2014-06-28 MED ORDER — HYDRALAZINE HCL 20 MG/ML IJ SOLN
10.0000 mg | Freq: Once | INTRAMUSCULAR | Status: AC
  Administered 2014-06-28: 10 mg via INTRAVENOUS

## 2014-06-28 MED ORDER — THROMBIN 5000 UNITS EX SOLR
CUTANEOUS | Status: AC
  Filled 2014-06-28: qty 5000

## 2014-06-28 MED ORDER — DEXTROSE 5 % IV SOLN
500.0000 mg | Freq: Four times a day (QID) | INTRAVENOUS | Status: DC | PRN
  Administered 2014-06-28: 500 mg via INTRAVENOUS
  Filled 2014-06-28 (×3): qty 5

## 2014-06-28 MED ORDER — CEFAZOLIN SODIUM-DEXTROSE 2-3 GM-% IV SOLR
INTRAVENOUS | Status: AC
  Filled 2014-06-28: qty 50

## 2014-06-28 MED ORDER — DEXAMETHASONE SODIUM PHOSPHATE 10 MG/ML IJ SOLN
INTRAMUSCULAR | Status: AC
  Filled 2014-06-28: qty 1

## 2014-06-28 SURGICAL SUPPLY — 59 items
BAG SPEC THK2 15X12 ZIP CLS (MISCELLANEOUS) ×1
BAG ZIPLOCK 12X15 (MISCELLANEOUS) ×3 IMPLANT
BLADE SAW SAG 73X25 THK (BLADE) ×2
BLADE SAW SGTL 73X25 THK (BLADE) ×1 IMPLANT
CAPT HIP TOTAL 2 ×2 IMPLANT
DRAPE INCISE IOBAN 85X60 (DRAPES) ×3 IMPLANT
DRAPE ORTHO SPLIT 77X108 STRL (DRAPES) ×6
DRAPE POUCH INSTRU U-SHP 10X18 (DRAPES) ×3 IMPLANT
DRAPE SURG 17X11 SM STRL (DRAPES) ×3 IMPLANT
DRAPE SURG ORHT 6 SPLT 77X108 (DRAPES) ×2 IMPLANT
DRAPE U-SHAPE 47X51 STRL (DRAPES) ×3 IMPLANT
DRSG ADAPTIC 3X8 NADH LF (GAUZE/BANDAGES/DRESSINGS) IMPLANT
DRSG AQUACEL AG ADV 3.5X10 (GAUZE/BANDAGES/DRESSINGS) ×2 IMPLANT
DRSG AQUACEL AG ADV 3.5X14 (GAUZE/BANDAGES/DRESSINGS) IMPLANT
DRSG PAD ABDOMINAL 8X10 ST (GAUZE/BANDAGES/DRESSINGS) IMPLANT
DRSG TEGADERM 4X4.75 (GAUZE/BANDAGES/DRESSINGS) IMPLANT
DURAPREP 26ML APPLICATOR (WOUND CARE) ×3 IMPLANT
ELECT BLADE TIP CTD 4 INCH (ELECTRODE) ×3 IMPLANT
ELECT REM PT RETURN 9FT ADLT (ELECTROSURGICAL) ×3
ELECTRODE REM PT RTRN 9FT ADLT (ELECTROSURGICAL) ×1 IMPLANT
EVACUATOR 1/8 PVC DRAIN (DRAIN) ×3 IMPLANT
FACESHIELD WRAPAROUND (MASK) ×15 IMPLANT
FACESHIELD WRAPAROUND OR TEAM (MASK) ×4 IMPLANT
GAUZE SPONGE 2X2 8PLY STRL LF (GAUZE/BANDAGES/DRESSINGS) IMPLANT
GAUZE SPONGE 4X4 12PLY STRL (GAUZE/BANDAGES/DRESSINGS) IMPLANT
GLOVE BIOGEL PI IND STRL 6.5 (GLOVE) ×1 IMPLANT
GLOVE BIOGEL PI IND STRL 8 (GLOVE) ×1 IMPLANT
GLOVE BIOGEL PI INDICATOR 6.5 (GLOVE) ×2
GLOVE BIOGEL PI INDICATOR 8 (GLOVE) ×2
GLOVE ECLIPSE 8.0 STRL XLNG CF (GLOVE) ×6 IMPLANT
GLOVE SURG SS PI 6.5 STRL IVOR (GLOVE) IMPLANT
GOWN STRL REUS W/TWL LRG LVL3 (GOWN DISPOSABLE) ×3 IMPLANT
GOWN STRL REUS W/TWL XL LVL3 (GOWN DISPOSABLE) ×3 IMPLANT
IMMOBILIZER KNEE 20 (SOFTGOODS) ×3
IMMOBILIZER KNEE 20 THIGH 36 (SOFTGOODS) ×1 IMPLANT
KIT BASIN OR (CUSTOM PROCEDURE TRAY) ×3 IMPLANT
LIQUID BAND (GAUZE/BANDAGES/DRESSINGS) ×2 IMPLANT
MANIFOLD NEPTUNE II (INSTRUMENTS) ×3 IMPLANT
NEEDLE HYPO 22GX1.5 SAFETY (NEEDLE) ×6 IMPLANT
PACK TOTAL JOINT (CUSTOM PROCEDURE TRAY) ×3 IMPLANT
POSITIONER SURGICAL ARM (MISCELLANEOUS) ×3 IMPLANT
SPONGE GAUZE 2X2 STER 10/PKG (GAUZE/BANDAGES/DRESSINGS)
SPONGE LAP 18X18 X RAY DECT (DISPOSABLE) IMPLANT
SPONGE LAP 4X18 X RAY DECT (DISPOSABLE) ×3 IMPLANT
SPONGE SURGIFOAM ABS GEL 100 (HEMOSTASIS) ×3 IMPLANT
STAPLER VISISTAT 35W (STAPLE) IMPLANT
SUCTION FRAZIER TIP 10 FR DISP (SUCTIONS) ×3 IMPLANT
SUT MNCRL AB 4-0 PS2 18 (SUTURE) IMPLANT
SUT VIC AB 0 CT1 27 (SUTURE)
SUT VIC AB 0 CT1 27XBRD ANTBC (SUTURE) IMPLANT
SUT VIC AB 1 CT1 27 (SUTURE) ×12
SUT VIC AB 1 CT1 27XBRD ANTBC (SUTURE) ×4 IMPLANT
SUT VIC AB 2-0 CT1 27 (SUTURE)
SUT VIC AB 2-0 CT1 TAPERPNT 27 (SUTURE) IMPLANT
SYR 20CC LL (SYRINGE) ×6 IMPLANT
TOWEL OR 17X26 10 PK STRL BLUE (TOWEL DISPOSABLE) ×6 IMPLANT
TRAY FOLEY CATH 14FRSI W/METER (CATHETERS) ×1 IMPLANT
TRAY FOLEY CATH 16FR SILVER (SET/KITS/TRAYS/PACK) ×2 IMPLANT
WATER STERILE IRR 1500ML POUR (IV SOLUTION) ×6 IMPLANT

## 2014-06-28 NOTE — Brief Op Note (Signed)
06/28/2014  2:31 PM  PATIENT:  Juandiego T Renfrew  59 y.o. male  PRE-OPERATIVE DIAGNOSIS:  right hip Primary osteoarthritis   POST-OPERATIVE DIAGNOSIS:  right hip Primary steoarthritis  PROCEDURE:  Procedure(s): RIGHT TOTAL HIP ARTHROPLASTY (Right)  SURGEON:  Surgeon(s) and Role:    * Jacki Conesonald A Rettie Laird, MD - Primary  PHYSICIAN ASSISTANT: Dimitri PedAmber Constable PA  ASSISTANTS: Dimitri PedAmber Constable PA   ANESTHESIA:   general  EBL:  Total I/O In: 1000 [I.V.:1000] Out: 750 [Urine:250; Blood:500]  BLOOD ADMINISTERED:none  DRAINS: none   LOCAL MEDICATIONS USED:  BUPIVICAINE 20cc mixed with 20cc of Normal Saline.  SPECIMEN:  No Specimen  DISPOSITION OF SPECIMEN:  N/A  COUNTS:  YES  TOURNIQUET:  * No tourniquets in log *  DICTATION: .Other Dictation: Dictation Number [id number]  PLAN OF CARE: Admit to inpatient   PATIENT DISPOSITION:  Stable in OR   Delay start of Pharmacological VTE agent (>24hrs) due to surgical blood loss or risk of bleeding: yes

## 2014-06-28 NOTE — Interval H&P Note (Signed)
History and Physical Interval Note:  06/28/2014 12:50 PM  Derek Rollins  has presented today for surgery, with the diagnosis of right hip osteoarthritis  The various methods of treatment have been discussed with the patient and family. After consideration of risks, benefits and other options for treatment, the patient has consented to  Procedure(s): RIGHT TOTAL HIP ARTHROPLASTY (Right) as a surgical intervention .  The patient's history has been reviewed, patient examined, no change in status, stable for surgery.  I have reviewed the patient's chart and labs.  Questions were answered to the patient's satisfaction.     Abdel Effinger A

## 2014-06-28 NOTE — Anesthesia Preprocedure Evaluation (Signed)
Anesthesia Evaluation  Patient identified by MRN, date of birth, ID band Patient awake    Reviewed: Allergy & Precautions, NPO status , Patient's Chart, lab work & pertinent test results  Airway Mallampati: II  TM Distance: >3 FB Neck ROM: Full    Dental no notable dental hx.    Pulmonary Current Smoker,  breath sounds clear to auscultation  Pulmonary exam normal       Cardiovascular hypertension, Pt. on medications Rhythm:Regular Rate:Normal     Neuro/Psych negative neurological ROS  negative psych ROS   GI/Hepatic negative GI ROS, Neg liver ROS,   Endo/Other  negative endocrine ROS  Renal/GU negative Renal ROS  negative genitourinary   Musculoskeletal negative musculoskeletal ROS (+)   Abdominal   Peds negative pediatric ROS (+)  Hematology negative hematology ROS (+)   Anesthesia Other Findings   Reproductive/Obstetrics negative OB ROS                             Anesthesia Physical Anesthesia Plan  ASA: II  Anesthesia Plan: General   Post-op Pain Management:    Induction: Intravenous  Airway Management Planned: Oral ETT  Additional Equipment:   Intra-op Plan:   Post-operative Plan: Extubation in OR  Informed Consent: I have reviewed the patients History and Physical, chart, labs and discussed the procedure including the risks, benefits and alternatives for the proposed anesthesia with the patient or authorized representative who has indicated his/her understanding and acceptance.   Dental advisory given  Plan Discussed with: CRNA and Surgeon  Anesthesia Plan Comments:         Anesthesia Quick Evaluation

## 2014-06-28 NOTE — Interval H&P Note (Signed)
History and Physical Interval Note:  06/28/2014 12:51 PM  Derek Rollins  has presented today for surgery, with the diagnosis of right hip osteoarthritis  The various methods of treatment have been discussed with the patient and family. After consideration of risks, benefits and other options for treatment, the patient has consented to  Procedure(s): RIGHT TOTAL HIP ARTHROPLASTY (Right) as a surgical intervention .  The patient's history has been reviewed, patient examined, no change in status, stable for surgery.  I have reviewed the patient's chart and labs.  Questions were answered to the patient's satisfaction.     Claudette Wermuth A

## 2014-06-28 NOTE — Anesthesia Postprocedure Evaluation (Signed)
  Anesthesia Post-op Note  Patient: Derek Rollins  Procedure(s) Performed: Procedure(s) (LRB): RIGHT TOTAL HIP ARTHROPLASTY (Right)  Patient Location: PACU  Anesthesia Type: General  Level of Consciousness: awake and alert   Airway and Oxygen Therapy: Patient Spontanous Breathing  Post-op Pain: mild  Post-op Assessment: Post-op Vital signs reviewed, Patient's Cardiovascular Status Stable, Respiratory Function Stable, Patent Airway and No signs of Nausea or vomiting  Last Vitals:  Filed Vitals:   06/28/14 1545  BP: 199/92  Pulse: 82  Temp:   Resp: 14    Post-op Vital Signs: stable   Complications: No apparent anesthesia complications

## 2014-06-28 NOTE — Transfer of Care (Signed)
Immediate Anesthesia Transfer of Care Note  Patient: Derek Rollins  Procedure(s) Performed: Procedure(s): RIGHT TOTAL HIP ARTHROPLASTY (Right)  Patient Location: PACU  Anesthesia Type:General  Level of Consciousness: awake, alert  and oriented  Airway & Oxygen Therapy: Patient Spontanous Breathing and Patient connected to face mask oxygen  Post-op Assessment: Report given to PACU RN and Post -op Vital signs reviewed and stable  Post vital signs: Reviewed and stable  Complications: No apparent anesthesia complications

## 2014-06-29 ENCOUNTER — Inpatient Hospital Stay (HOSPITAL_COMMUNITY): Payer: BLUE CROSS/BLUE SHIELD

## 2014-06-29 ENCOUNTER — Encounter (HOSPITAL_COMMUNITY): Payer: Self-pay | Admitting: Orthopedic Surgery

## 2014-06-29 LAB — CBC
HCT: 33.6 % — ABNORMAL LOW (ref 39.0–52.0)
HEMOGLOBIN: 11.3 g/dL — AB (ref 13.0–17.0)
MCH: 29.1 pg (ref 26.0–34.0)
MCHC: 33.6 g/dL (ref 30.0–36.0)
MCV: 86.6 fL (ref 78.0–100.0)
Platelets: 287 10*3/uL (ref 150–400)
RBC: 3.88 MIL/uL — ABNORMAL LOW (ref 4.22–5.81)
RDW: 12.7 % (ref 11.5–15.5)
WBC: 11.3 10*3/uL — ABNORMAL HIGH (ref 4.0–10.5)

## 2014-06-29 LAB — BASIC METABOLIC PANEL
ANION GAP: 7 (ref 5–15)
BUN: 8 mg/dL (ref 6–23)
CHLORIDE: 106 meq/L (ref 96–112)
CO2: 25 mmol/L (ref 19–32)
Calcium: 8.6 mg/dL (ref 8.4–10.5)
Creatinine, Ser: 0.69 mg/dL (ref 0.50–1.35)
GFR calc Af Amer: >90 mL/min (ref >90)
GFR calc non Af Amer: >90 mL/min (ref >90)
Glucose, Bld: 138 mg/dL — ABNORMAL HIGH (ref 70–99)
Potassium: 4 mmol/L (ref 3.5–5.1)
Sodium: 138 mmol/L (ref 135–145)

## 2014-06-29 NOTE — Care Management Note (Addendum)
    Page 1 of 2   06/29/2014     11:16:47 AM CARE MANAGEMENT NOTE 06/29/2014  Patient:  YVON, MCCORD   Account Number:  [id number]  Date Initiated:  06/29/2014  Documentation initiated by:  [facility]  Subjective/Objective Assessment:   adm: RIGHT TOTAL HIP ARTHROPLASTY (Right)     Action/Plan:   discharge planning   Anticipated DC Date:  06/30/2014   Anticipated DC Plan:  [place]  CM consult      [facility]  HOME HEALTH   Choice offered to / List presented to:  C-1 Patient   DME arranged  3-N-1  Vassie Moselle      DME agency  [place] arranged  [place]   Status of service:  Completed, signed off Medicare Important Message given?   (If response is "NO", the following Medicare IM given date fields will be blank) Date Medicare IM given:   Medicare IM given by:   Date Additional Medicare IM given:   Additional Medicare IM given by:    Discharge Disposition:  [place]  Per UR Regulation:    If discussed at Long Length of Stay Meetings, dates discussed:    Comments:  06/29/14 08:00 Cm met with pt in room to offer choice of home health agency.  Pt chooses [facility] to render HHPT/OT. Address and contact information verified by pt.  CM called [facility] DME rep, Lecretia to please deliver 3n1 and rolling walker to room prior to discharge.  Referral given to [employer], Tim.  No other CM needs were communicated. Mariane Masters, BSN, CM [phone].

## 2014-06-29 NOTE — Progress Notes (Signed)
Physical Therapy Treatment Note    06/29/14 1500  PT Visit Information  Last PT Received On 06/29/14  Assistance Needed +1  History of Present Illness Pt is a 59 year old male s/p R THA  PT Time Calculation  PT Start Time (ACUTE ONLY) 1400  PT Stop Time (ACUTE ONLY) 1416  PT Time Calculation (min) (ACUTE ONLY) 16 min  Subjective Data  Subjective Pt ambulated good distance in hallway, only requring a few cues for posterior hip precautions.  Precautions  Precautions Posterior Hip  Restrictions  Weight Bearing Restrictions Yes  RLE Weight Bearing PWB  RLE Partial Weight Bearing Percentage or Pounds 50  Pain Assessment  Pain Assessment No/denies pain  Cognition  Arousal/Alertness Awake/alert  Behavior During Therapy WFL for tasks assessed/performed  Overall Cognitive Status Within Functional Limits for tasks assessed  Bed Mobility  Overal bed mobility Needs Assistance  Bed Mobility Supine to Sit;Sit to Supine  Supine to sit Supervision  General bed mobility comments multimodal cues for technique within precautions  Transfers  Overall transfer level Needs assistance  Equipment used Rolling walker (2 wheeled)  Transfers Sit to/from Stand  Sit to Stand Supervision  General transfer comment verbal and visual cues for technique within precautions  Ambulation/Gait  Ambulation/Gait assistance Supervision  Ambulation Distance (Feet) 140 Feet  Assistive device Rolling walker (2 wheeled)  Gait Pattern/deviations Step-to pattern;Antalgic  General Gait Details verbal and visual cues for sequence, safety, hip precautions, RW distance  PT - End of Session  Activity Tolerance Patient tolerated treatment well  Patient left in bed;with call bell/phone within reach;with family/visitor present  PT - Assessment/Plan  PT Plan Current plan remains appropriate  PT Frequency (ACUTE ONLY) 7X/week  Follow Up Recommendations Home health PT  PT equipment Rolling walker with 5" wheels  PT Goal  Progression  Progress towards PT goals Progressing toward goals  PT General Charges  $$ ACUTE PT VISIT 1 Procedure  PT Treatments  $Gait Training 8-22 mins   Zenovia JarredKati Leanndra Pember, PT, DPT 06/29/2014 Pager: [phone]

## 2014-06-29 NOTE — Evaluation (Signed)
Occupational Therapy Evaluation Patient Details Name: Derek Rollins T Pavlik MRN: [id number] DOB: 07/21/1955 Today's Date: 06/29/2014    History of Present Illness Pt is s/p R THA   Clinical Impression   Pt doing well. No family present for education. Reinforced THPs during ADL and pt able to follow with min cues. Recommend HHOT to reinforce education with family as no family available for education during session. Pt will need 3in1.     Follow Up Recommendations  Home health OT;Supervision/Assistance - 24 hour    Equipment Recommendations  3 in 1 bedside comode    Recommendations for Other Services       Precautions / Restrictions Precautions Precautions: Posterior Hip Precaution Booklet Issued: Yes (comment) Precaution Comments: handout in room. Reviewed precautions verbally and demonstrated all to pt. Restrictions Weight Bearing Restrictions: Yes RLE Weight Bearing: Partial weight bearing RLE Partial Weight Bearing Percentage or Pounds: 50      Mobility Bed Mobility                  Transfers Overall transfer level: Needs assistance Equipment used: Rolling walker (2 wheeled) Transfers: Sit to/from Stand Sit to Stand: Min guard         General transfer comment: verbal cues for THPS and hand placement.    Balance                                            ADL Overall ADL's : Needs assistance/impaired Eating/Feeding: Independent;Sitting   Grooming: Wash/dry hands;Set up;Sitting   Upper Body Bathing: Set up;Sitting   Lower Body Bathing: Moderate assistance;Sit to/from stand; without AE   Upper Body Dressing : Set up;Sitting   Lower Body Dressing: Moderate assistance;Sit to/from stand; without AE   Toilet Transfer: Minimal assistance;Ambulation;BSC;RW   Toileting- Clothing Manipulation and Hygiene: Minimal assistance;Sit to/from stand         General ADL Comments: Explained tub transfer object as pt is PWB and isnt allowed to step  into bathtub due to this weight bearing limitation. Pt states he doenst want to purchase tub DME and will sponge bathe. Explained how to use 3in1 over the toilet and explained he can use it to sit on for sponge bathing. Also explained AE options for LB self care as pt has hip precautions but pt states family will help. Emphasized NOT to bend forward to  reach for dressing, etc. Pt appears aware of hip precautions and able to follow precautions with min cues. He does need reminders to extend R LE out in front prior to sit and stand. No family present during session.     Vision                     Perception     Praxis      Pertinent Vitals/Pain Pain Assessment: 0-10 Pain Score: 3  Pain Descriptors / Indicators: Aching Pain Intervention(s): Repositioned     Hand Dominance     Extremity/Trunk Assessment Upper Extremity Assessment Upper Extremity Assessment: Overall WFL for tasks assessed           Communication Communication Communication: No difficulties   Cognition Arousal/Alertness: Awake/alert Behavior During Therapy: WFL for tasks assessed/performed Overall Cognitive Status: Within Functional Limits for tasks assessed                     General Comments  Exercises       Shoulder Instructions      Home Living Family/patient expects to be discharged to:: Private residence Living Arrangements: Spouse/significant other;Other relatives Available Help at Discharge: Family               Bathroom Shower/Tub: Chief Strategy Officer: Standard     Home Equipment: None          Prior Functioning/Environment Level of Independence: Independent             OT Diagnosis: Generalized weakness   OT Problem List: Decreased strength;Decreased knowledge of use of DME or AE;Decreased knowledge of precautions   OT Treatment/Interventions: Self-care/ADL training;Patient/family education;Therapeutic activities;DME and/or AE  instruction    OT Goals(Current goals can be found in the care plan section) Acute Rehab OT Goals Patient Stated Goal: home OT Goal Formulation: With patient Time For Goal Achievement: 07/06/14 Potential to Achieve Goals: Good  OT Frequency: Min 2X/week   Barriers to D/C:            Co-evaluation              End of Session Equipment Utilized During Treatment: Rolling walker  Activity Tolerance: Patient tolerated treatment well Patient left: in chair;with call bell/phone within reach   Time: 1030-1054 OT Time Calculation (min): 24 min Charges:  OT General Charges $OT Visit: 1 Procedure OT Evaluation $Initial OT Evaluation Tier I: 1 Procedure OT Treatments $Therapeutic Activity: 8-22 mins G-Codes:    Lennox Laity  [phone] 06/29/2014, 11:03 AM

## 2014-06-29 NOTE — Op Note (Signed)
NAME:  Derek Rollins, Derek Rollins                    ACCOUNT NO.:  [id number]  MEDICAL RECORD NO.:  [id number]  LOCATION:  1605                         FACILITY:  [facility]  PHYSICIAN:  Georges Lynch. [name], M.D.DATE OF BIRTH:  27-Nov-1955  DATE OF PROCEDURE:  06/28/2014 DATE OF DISCHARGE:                              OPERATIVE REPORT   SURGEON:  Georges Lynch. Darrelyn Hillock, M.D.  ASSISTANT:  Dimitri Ped, [place]  PREOPERATIVE DIAGNOSIS:  Severe osteoarthritis of the right hip.  POSTOPERATIVE DIAGNOSIS:  Severe osteoarthritis of the right hip.  OPERATION:  Right total hip arthroplasty utilizing DePuy system.  I utilized a size 5 high offset Tri-Lock stem.  The ball was a 32 mm ceramic ball with a +1 neck.  The cup was a size 48 Pinnacle cup with 2 screws for fixation, one was a 25 mm in length and one was 30 mm in length.  Also, I did utilize a hole eliminator.  PROCEDURE DESCRIPTION:  The patient under sterile prep and draping of the right hip was carried out prior to procedure.  The patient was on left side, right side up.  He had 2 g of IV Ancef.  At this time, the appropriate time-out was carried out.  I also marked the appropriate right hip in the holding area.  Posterolateral approach to the hip was carried out.  Bleeders were identified and cauterized.  Self-retaining retractors were inserted.  I then made incision over the iliotibial band and incised a small portion of the gluteal muscle.  I then partially detached the external rotators.  Following that, I went down and inserted my retractors and identified the capsule.  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  Note, portion of the articular surface of the head literally was in the acetabulum free.  Following that, I then utilized my box osteotome to remove the cancellous bone from trochanter and then the widening reamer and then the canal finder was inserted.  I thoroughly irrigated out the canal.  I  then rasped the canal up to a size 5 Tri-Lock stem.  Thoroughly irrigated out the area.  I then packed the area with a 4 x 18 sponge which was later removed and then I packed the femoral canal.  Attention was directed to the acetabulum.  I did a capsulectomy at this particular point.  Following that, I then reamed the acetabulum up to a size 47 for 48 mm cup.  The cup was inserted at the appropriate angle utilizing the Charnley angle finder to verify that.  At this point, 2 screws were placed into the pinnacle cup for fixation.  I did check the lateral screw to make sure I could feel that it was not involving sciatic nerve.  I then irrigated out the area.  I inserted the hole eliminator.  I then inserted my permanent polyethylene cup.  Note, the cup size was a 48, inside diameter was 32 mm.  I then went back and went through my trials and finally selected a high offset stem and a +1 ball 32 mm diameter.  I then inserted my permanent Tri- Lock stem high  offset size 5.  We had excellent fixation.  I then went ahead and inserted the +1, 32 mm diameter ball, reduced the hip, and had excellent function.  The hip was stable.  I went through the leg lengths as well.  During the procedure, we were able to maintain the best leg lengths possible at this time as well and the same time maintaining stability. We could not have gone to any shorter of the neck length at this time. We thoroughly irrigated out the area.  I closed the wound layers in usual fashion.  I did inject 20 mL of Exparel with 20 mL of normal saline.  Sterile dressings were applied.          ______________________________ Georges Lynchonald A. Darrelyn HillockGioffre, M.D.     RAG/MEDQ  D:  06/28/2014  T:  06/29/2014  Job:  [id number]

## 2014-06-29 NOTE — Evaluation (Signed)
Physical Therapy Evaluation Patient Details Name: Derek Rollins MRN: [id number] DOB: [birth year] Today's Date: 06/29/2014   History of Present Illness  Pt is a 59 year old male s/p R THA  Clinical Impression  Pt is s/p R THA resulting in the deficits listed below (see PT Problem List).  Pt will benefit from skilled PT to increase their independence and safety with mobility to allow discharge to the venue listed below.  Pt mobilizing well POD#1 and appears to understand hip precautions.  Pt reports plan to d/c home with sister.     Follow Up Recommendations Home health PT    Equipment Recommendations  Rolling walker with 5" wheels    Recommendations for Other Services       Precautions / Restrictions Precautions Precautions: Posterior Hip Precaution Booklet Issued: Yes (comment) Precaution Comments: educated pt on preautions verbally, demonstrated, and provided handout Restrictions Weight Bearing Restrictions: Yes RLE Weight Bearing: Partial weight bearing RLE Partial Weight Bearing Percentage or Pounds: 50      Mobility  Bed Mobility Overal bed mobility: Needs Assistance Bed Mobility: Supine to Sit     Supine to sit: Supervision     General bed mobility comments: multimodal cues for technique within precautions  Transfers Overall transfer level: Needs assistance Equipment used: Rolling walker (2 wheeled) Transfers: Sit to/from Stand Sit to Stand: Min guard         General transfer comment: verbal and visual cues for technique within precautions  Ambulation/Gait Ambulation/Gait assistance: Min guard Ambulation Distance (Feet): 80 Feet Assistive device: Rolling walker (2 wheeled) Gait Pattern/deviations: Step-to pattern;Antalgic     General Gait Details: verbal and visual cues for sequence, safety, hip precautions, RW distance  Stairs            Wheelchair Mobility    Modified Rankin (Stroke Patients Only)       Balance                                              Pertinent Vitals/Pain Pain Assessment: 0-10 Pain Score: 1  Pain Location: R hip Pain Descriptors / Indicators: Sore;Aching Pain Intervention(s): Limited activity within patient's tolerance;Monitored during session;Premedicated before session;Repositioned    Home Living Family/patient expects to be discharged to:: Private residence Living Arrangements: Spouse/significant other;Other relatives Available Help at Discharge: Family (sister)   Home Access: Level entry     Home Layout: One level Home Equipment: None      Prior Function Level of Independence: Independent               Hand Dominance        Extremity/Trunk Assessment   Upper Extremity Assessment: Overall WFL for tasks assessed           Lower Extremity Assessment: RLE deficits/detail RLE Deficits / Details: functional hip weakness observed       Communication   Communication: No difficulties;Prefers language other than English ([place] however able to understand moderate amount of [place])  Cognition Arousal/Alertness: Awake/alert Behavior During Therapy: WFL for tasks assessed/performed Overall Cognitive Status: Within Functional Limits for tasks assessed                      General Comments      Exercises Total Joint Exercises Ankle Circles/Pumps: AROM;Both;15 reps Quad Sets: AROM;Both;15 reps Towel Squeeze: AROM;Both;15 reps Short Arc Quad: AROM;Right;15 reps  Heel Slides: AAROM;Right;15 reps (within precautions) Hip ABduction/ADduction: AROM;Right;15 reps Straight Leg Raises: AAROM;Right;10 reps      Assessment/Plan    PT Assessment Patient needs continued PT services  PT Diagnosis Difficulty walking   PT Problem List Decreased strength;Decreased mobility;Decreased knowledge of precautions;Pain;Decreased knowledge of use of DME  PT Treatment Interventions Gait training;DME instruction;Functional mobility training;Patient/family  education;Therapeutic activities;Therapeutic exercise;Stair training   PT Goals (Current goals can be found in the Care Plan section) Acute Rehab PT Goals Patient Stated Goal: home PT Goal Formulation: With patient Time For Goal Achievement: 07/02/14 Potential to Achieve Goals: Good    Frequency 7X/week   Barriers to discharge        Co-evaluation               End of Session   Activity Tolerance: Patient tolerated treatment well Patient left: in chair;with call bell/phone within reach           Time: 0913-0936 PT Time Calculation (min) (ACUTE ONLY): 23 min   Charges:   PT Evaluation $Initial PT Evaluation Tier I: 1 Procedure PT Treatments $Gait Training: 8-22 mins $Therapeutic Exercise: 8-22 mins   PT G Codes:        Derek Rollins,KATHrine E 06/29/2014, 11:50 AM Zenovia Jarred, PT, DPT 06/29/2014 Pager: [phone]

## 2014-06-29 NOTE — Progress Notes (Signed)
Subjective: 1 Day Post-Op Procedure(s) (LRB): RIGHT TOTAL HIP ARTHROPLASTY (Right) Patient reports pain as 1 on 0-10 scale.Doing very well. Will DC tomorrow.    Objective: Vital signs in last 24 hours: Temp:  [97.6 F (36.4 C)-98.3 F (36.8 C)] 98.3 F (36.8 C) (01/19 1413) Pulse Rate:  [80-101] 80 (01/19 1413) Resp:  [13-17] 14 (01/19 1413) BP: (109-199)/(64-98) 114/73 mmHg (01/19 1413) SpO2:  [97 %-100 %] 98 % (01/19 1413) Weight:  [49.442 kg (109 lb)] 49.442 kg (109 lb) (01/18 1644)  Intake/Output from previous day: 01/18 0701 - 01/19 0700 In: 4490 [I.V.:4390; IV Piggyback:100] Out: 4400 [Urine:3900; Blood:500] Intake/Output this shift: Total I/O In: 240 [P.O.:240] Out: 702 [Urine:700; Stool:2]   Recent Labs  06/29/14 0433  HGB 11.3*    Recent Labs  06/29/14 0433  WBC 11.3*  RBC 3.88*  HCT 33.6*  PLT 287    Recent Labs  06/29/14 0433  NA 138  K 4.0  CL 106  CO2 25  BUN 8  CREATININE 0.69  GLUCOSE 138*  CALCIUM 8.6   No results for input(s): LABPT, INR in the last 72 hours.  Dorsiflexion/Plantar flexion intact No cellulitis present  Assessment/Plan: 1 Day Post-Op Procedure(s) (LRB): RIGHT TOTAL HIP ARTHROPLASTY (Right) Up with therapy discontinued tomorrow.  Kreed Kauffman A 06/29/2014, 3:37 PM

## 2014-06-30 LAB — CBC
HCT: 31.2 % — ABNORMAL LOW (ref 39.0–52.0)
HEMOGLOBIN: 10.5 g/dL — AB (ref 13.0–17.0)
MCH: 29.2 pg (ref 26.0–34.0)
MCHC: 33.7 g/dL (ref 30.0–36.0)
MCV: 86.7 fL (ref 78.0–100.0)
PLATELETS: 270 10*3/uL (ref 150–400)
RBC: 3.6 MIL/uL — AB (ref 4.22–5.81)
RDW: 13 % (ref 11.5–15.5)
WBC: 10.7 10*3/uL — AB (ref 4.0–10.5)

## 2014-06-30 LAB — BASIC METABOLIC PANEL
Anion gap: 7 (ref 5–15)
BUN: 10 mg/dL (ref 6–23)
CHLORIDE: 107 meq/L (ref 96–112)
CO2: 25 mmol/L (ref 19–32)
CREATININE: 0.68 mg/dL (ref 0.50–1.35)
Calcium: 8.4 mg/dL (ref 8.4–10.5)
GFR calc Af Amer: >90 mL/min (ref >90)
GFR calc non Af Amer: >90 mL/min (ref >90)
Glucose, Bld: 96 mg/dL (ref 70–99)
Potassium: 3.5 mmol/L (ref 3.5–5.1)
Sodium: 139 mmol/L (ref 135–145)

## 2014-06-30 MED ORDER — DOCUSATE SODIUM 100 MG PO CAPS: 100.0000 mg | ORAL_CAPSULE | Freq: Two times a day (BID) | ORAL | Status: DC

## 2014-06-30 MED ORDER — HYDROCODONE-ACETAMINOPHEN 5-325 MG PO TABS: 1.0000 | ORAL_TABLET | ORAL | Status: DC | PRN

## 2014-06-30 MED ORDER — FERROUS SULFATE 325 (65 FE) MG PO TABS: 325.0000 mg | ORAL_TABLET | Freq: Three times a day (TID) | ORAL | Status: DC

## 2014-06-30 MED ORDER — METHOCARBAMOL 500 MG PO TABS: 500.0000 mg | ORAL_TABLET | Freq: Four times a day (QID) | ORAL | Status: DC | PRN

## 2014-06-30 MED ORDER — RIVAROXABAN 10 MG PO TABS: 10.0000 mg | ORAL_TABLET | Freq: Every day | ORAL | Status: DC

## 2014-06-30 NOTE — Progress Notes (Signed)
Utilization review completed.  

## 2014-06-30 NOTE — Progress Notes (Signed)
Physical Therapy Treatment Patient Details Name: Derek Rollins Inscoe MRN: [id number] DOB: 1955-11-30 Today's Date: 06/30/2014    History of Present Illness Pt is a 59 year old male s/p R THA    PT Comments    Pt ambulated good distance in hallway, reporting no pain, also practiced safe stair technique.  Pt performed LE exercises.  Pt able to demonstrate with arms and verbalize posterior hip precautions.  Pt had no further questions and plans to d/c home with his sister today.   Follow Up Recommendations  Home health PT     Equipment Recommendations  Rolling walker with 5" wheels    Recommendations for Other Services       Precautions / Restrictions Precautions Precautions: Posterior Hip Precaution Comments: Reviewed all hip precautions with pt Restrictions Weight Bearing Restrictions: Yes RLE Weight Bearing: Partial weight bearing RLE Partial Weight Bearing Percentage or Pounds: 50    Mobility  Bed Mobility Overal bed mobility: Needs Assistance Bed Mobility: Supine to Sit     Supine to sit: Supervision     General bed mobility comments: up in recliner on arrival  Transfers Overall transfer level: Needs assistance Equipment used: Rolling walker (2 wheeled) Transfers: Sit to/from Stand Sit to Stand: Supervision         General transfer comment: pt appropriately demonstrated safe technique within precautions  Ambulation/Gait Ambulation/Gait assistance: Supervision Ambulation Distance (Feet): 350 Feet Assistive device: Rolling walker (2 wheeled) Gait Pattern/deviations: Step-to pattern;Antalgic     General Gait Details: verbal and visual cues for hip precautions, RW distance   Stairs Stairs: Yes Stairs assistance: Supervision Stair Management: Step to pattern;Forwards;Two rails Number of Stairs: 2 General stair comments: verbal and tactile cues for sequence and safety  Wheelchair Mobility    Modified Rankin (Stroke Patients Only)       Balance                                    Cognition Arousal/Alertness: Awake/alert Behavior During Therapy: WFL for tasks assessed/performed Overall Cognitive Status: Within Functional Limits for tasks assessed                      Exercises Total Joint Exercises Ankle Circles/Pumps: AROM;Both;15 reps Quad Sets: AROM;Both;15 reps Towel Squeeze: AROM;Both;15 reps Short Arc Quad: AROM;Right;15 reps Heel Slides: AAROM;Right;15 reps (within precautions) Hip ABduction/ADduction: AROM;Right;15 reps Straight Leg Raises: Right;10 reps;AROM    General Comments        Pertinent Vitals/Pain Pain Assessment: No/denies pain Pain Score: 2  Pain Location: R hip Pain Descriptors / Indicators: Sore Pain Intervention(s): Monitored during session;Premedicated before session;Repositioned    Home Living                      Prior Function            PT Goals (current goals can now be found in the care plan section) Progress towards PT goals: Progressing toward goals    Frequency  7X/week    PT Plan Current plan remains appropriate    Co-evaluation             End of Session   Activity Tolerance: Patient tolerated treatment well Patient left: with call bell/phone within reach;in chair     Time: [id number] PT Time Calculation (min) (ACUTE ONLY): 23 min  Charges:  $Gait Training: 8-22 mins $Therapeutic Exercise: 8-22 mins  G Codes:      Makeba Delcastillo,KATHrine E 07/01/14, 12:58 PM Zenovia Jarred, PT, DPT Jul 01, 2014 Pager: [phone]

## 2014-06-30 NOTE — Progress Notes (Signed)
   Subjective: 2 Days Post-Op Procedure(s) (LRB): RIGHT TOTAL HIP ARTHROPLASTY (Right) Patient reports pain as mild.   Patient seen in rounds with Dr. Darrelyn HillockGioffre. Patient is well, and has had no acute complaints or problems. Reports minimal pain. No issues overnight. Reports that he is ready to go home today.   Objective: Vital signs in last 24 hours: Temp:  [98.2 F (36.8 C)-98.3 F (36.8 C)] 98.2 F (36.8 C) (01/20 0527) Pulse Rate:  [80-88] 83 (01/20 0527) Resp:  [14-16] 16 (01/20 0527) BP: (110-121)/(69-73) 121/73 mmHg (01/20 0527) SpO2:  [97 %-99 %] 98 % (01/20 0527)  Intake/Output from previous day:  Intake/Output Summary (Last 24 hours) at 06/30/14 0821 Last data filed at 06/30/14 0700  Gross per 24 hour  Intake    240 ml  Output   2828 ml  Net  -2588 ml     Labs:  Recent Labs  06/29/14 0433 06/30/14 0413  HGB 11.3* 10.5*    Recent Labs  06/29/14 0433 06/30/14 0413  WBC 11.3* 10.7*  RBC 3.88* 3.60*  HCT 33.6* 31.2*  PLT 287 270    Recent Labs  06/29/14 0433 06/30/14 0413  NA 138 139  K 4.0 3.5  CL 106 107  CO2 25 25  BUN 8 10  CREATININE 0.69 0.68  GLUCOSE 138* 96  CALCIUM 8.6 8.4    EXAM General - Patient is Alert and Oriented Extremity - Neurologically intact Intact pulses distally Dorsiflexion/Plantar flexion intact No cellulitis present Compartment soft Dressing/Incision - clean, dry, no drainage Motor Function - intact, moving foot and toes well on exam.   Past Medical History  Diagnosis Date  . Hypertension   . Arthritis     Assessment/Plan: 2 Days Post-Op Procedure(s) (LRB): RIGHT TOTAL HIP ARTHROPLASTY (Right) Active Problems:   H/O total hip arthroplasty  Estimated body mass index is 18.7 kg/(m^2) as calculated from the following:   Height as of this encounter: 5\' 4"  (1.626 m).   Weight as of this encounter: 49.442 kg (109 lb). Advance diet Up with therapy D/C IV fluids  DVT Prophylaxis - Xarelto Partial  weightbearing 50% right LE  Doing very well. DC home today after PT. Family planning on being here during discharge to help translate discharge instructions.   Dimitri PedAmber Deagan Sevin, PA-C Orthopaedic Surgery 06/30/2014, 8:21 AM

## 2014-06-30 NOTE — Progress Notes (Signed)
Occupational Therapy Treatment Patient Details Name: Derek Rollins MRN: [id number] DOB: [birth year] Today's Date: 06/30/2014    History of present illness Pt is a 59 year old male s/p R THA   OT comments  Pt doing well. Supposed to d/c today. Recommend HHOT to reinforce THPs and ADL with family.  Follow Up Recommendations  Home health OT;Supervision/Assistance - 24 hour    Equipment Recommendations  3 in 1 bedside comode    Recommendations for Other Services      Precautions / Restrictions Precautions Precautions: Posterior Hip Precaution Comments: Reviewed all hip precautions with pt Restrictions Weight Bearing Restrictions: Yes RLE Weight Bearing: Partial weight bearing RLE Partial Weight Bearing Percentage or Pounds: 50       Mobility Bed Mobility Overal bed mobility: Needs Assistance Bed Mobility: Supine to Sit     Supine to sit: Supervision     General bed mobility comments: verbal cues for THPs and technique.  Transfers Overall transfer level: Needs assistance Equipment used: Rolling walker (2 wheeled) Transfers: Sit to/from Stand Sit to Stand: Supervision         General transfer comment: pt did well with keeping R LE out in front of him. Min cues for hand placement.    Balance                                   ADL                           Toilet Transfer: Supervision/safety;Ambulation;RW             General ADL Comments: No family present and pt states family not available for education this am but coming for d/c instructions. Pt wanted to practice getting dressed. Reviewed sequence for LB dressing and to don pants over R LE first so he can lift the nonoperated LE to slide into pants last. Donned shoes and socks for pt due to hip precautions and pt not interested in obtaining AE. Pt remembered need to stand for toileting hygiene. Reviewed 3in1 use over toilet also. Pt needs min cues for posture to not exceed 90 degrees and  also for THPs with bed mobility as pt moving quickly and needed to move shoulders more to the L to keep straighter angle at hip. Pt states he feels comfortable with ADL and family helping. Recommend HHOT to reinforce precautions with family.  Reinforced importance to not push down to don foot into shoe due to [facility] but to let family help slide shoe over heel.       Vision                     Perception     Praxis      Cognition   Behavior During Therapy: WFL for tasks assessed/performed Overall Cognitive Status: Within Functional Limits for tasks assessed                       Extremity/Trunk Assessment               Exercises     Shoulder Instructions       General Comments      Pertinent Vitals/ Pain       Pain Assessment: 0-10 Pain Score: 2  Pain Location: R hip Pain Descriptors / Indicators: Sore Pain Intervention(s): Repositioned  Home Living  Prior Functioning/Environment              Frequency Min 2X/week     Progress Toward Goals  OT Goals(current goals can now be found in the care plan section)  Progress towards OT goals: Progressing toward goals     Plan Discharge plan remains appropriate    Co-evaluation                 End of Session Equipment Utilized During Treatment: Rolling walker   Activity Tolerance Patient tolerated treatment well   Patient Left in chair;with call bell/phone within reach   Nurse Communication          Time: [id number] OT Time Calculation (min): 19 min  Charges: OT General Charges $OT Visit: 1 Procedure OT Treatments $Self Care/Home Management : 8-22 mins  Lennox LaityStone, Laneya Gasaway [place]  [phone] 06/30/2014, 9:12 AM

## 2014-06-30 NOTE — Discharge Instructions (Addendum)
Walk with your walker. Partial weightbearing 50% right leg Home Health Agency will follow you at home for your therapy  Change your dressing daily. Shower only, no tub bath. Call if any temperatures greater than 101 or any wound complications: [phone] during the day and ask for Dr. Jeannetta EllisGioffre's nurse, Mackey Birchwoodammy Johnson.  Information on my medicine - XARELTO (Rivaroxaban)  This medication education was reviewed with me or my healthcare representative as part of my discharge preparation.  The pharmacist that spoke with me during my hospital stay was:  Miracle Mongillo A, RPH  Why was Xarelto prescribed for you? Xarelto was prescribed for you to reduce the risk of blood clots forming after orthopedic surgery. The medical term for these abnormal blood clots is venous thromboembolism (VTE).  What do you need to know about xarelto ? Take your Xarelto ONCE DAILY at the same time every day. You may take it either with or without food.  If you have difficulty swallowing the tablet whole, you may crush it and mix in applesauce just prior to taking your dose.  Take Xarelto exactly as prescribed by your doctor and DO NOT stop taking Xarelto without talking to the doctor who prescribed the medication.  Stopping without other VTE prevention medication to take the place of Xarelto may increase your risk of developing a clot.  After discharge, you should have regular check-up appointments with your healthcare provider that is prescribing your Xarelto.    What do you do if you miss a dose? If you miss a dose, take it as soon as you remember on the same day then continue your regularly scheduled once daily regimen the next day. Do not take two doses of Xarelto on the same day.   Important Safety Information A possible side effect of Xarelto is bleeding. You should call your healthcare provider right away if you experience any of the following: ? Bleeding from an injury or your nose that does not  stop. ? Unusual colored urine (red or dark brown) or unusual colored stools (red or black). ? Unusual bruising for unknown reasons. ? A serious fall or if you hit your head (even if there is no bleeding).  Some medicines may interact with Xarelto and might increase your risk of bleeding while on Xarelto. To help avoid this, consult your healthcare provider or pharmacist prior to using any new prescription or non-prescription medications, including herbals, vitamins, non-steroidal anti-inflammatory drugs (NSAIDs) and supplements.  This website has more information on Xarelto: [email or web address].

## 2014-07-05 NOTE — Discharge Summary (Signed)
Physician Discharge Summary   Patient ID: PAXTYN BOYAR MRN: [id number] DOB/AGE: 1955-09-02 59 y.o.  Admit date: 06/28/2014 Discharge date: 06/30/2014  Primary Diagnosis: Osteoarthritis, right hip  Admission Diagnoses:  Past Medical History  Diagnosis Date  . Hypertension   . Arthritis    Discharge Diagnoses:   Active Problems:   H/O total hip arthroplasty  Estimated body mass index is 18.7 kg/(m^2) as calculated from the following:   Height as of this encounter: _0  (1.626 m).   Weight as of this encounter: 49.442 kg (109 lb).  Procedure(s) (LRB): RIGHT TOTAL HIP ARTHROPLASTY (Right)   Consults: None  HPI: 76 [address], 59 y.o. male, has a history of pain and functional disability in the right hip(s) due to arthritis and patient has failed non-surgical conservative treatments for greater than 12 weeks to include NSAID's and/or analgesics, flexibility and strengthening excercises and activity modification. Onset of symptoms was gradual starting 1 year ago with gradually worsening course since that time.The patient noted no past surgery on the right hip(s). Patient currently rates pain in the right hip at 8 out of 10 with activity. Patient has night pain, worsening of pain with activity and weight bearing, pain that interfers with activities of daily living and pain with passive range of motion. Patient has evidence of periarticular osteophytes and joint space narrowing by imaging studies. There is no current active infection.  Laboratory Data: Admission on 06/28/2014, Discharged on 06/30/2014  Component Date Value Ref Range Status  . WBC 06/29/2014 11.3* 4.0 - 10.5 K/uL Final  . RBC 06/29/2014 3.88* 4.22 - 5.81 MIL/uL Final  . Hemoglobin 06/29/2014 11.3* 13.0 - 17.0 g/dL Final  . HCT 06/29/2014 33.6* 39.0 - 52.0 % Final  . MCV 06/29/2014 86.6  78.0 - 100.0 fL Final  . MCH 06/29/2014 29.1  26.0 - 34.0 pg Final  . MCHC 06/29/2014 33.6  30.0 - 36.0 g/dL Final  . RDW 06/29/2014  12.7  11.5 - 15.5 % Final  . Platelets 06/29/2014 287  150 - 400 K/uL Final  . Sodium 06/29/2014 138  135 - 145 mmol/L Final   Please note change in reference range.  . Potassium 06/29/2014 4.0  3.5 - 5.1 mmol/L Final   Please note change in reference range.  . Chloride 06/29/2014 106  96 - 112 mEq/L Final  . CO2 06/29/2014 25  19 - 32 mmol/L Final  . Glucose, Bld 06/29/2014 138* 70 - 99 mg/dL Final  . BUN 06/29/2014 8  6 - 23 mg/dL Final  . Creatinine, Ser 06/29/2014 0.69  0.50 - 1.35 mg/dL Final  . Calcium 06/29/2014 8.6  8.4 - 10.5 mg/dL Final  . GFR calc non Af Amer 06/29/2014 >90  >90 mL/min Final  . GFR calc Af Amer 06/29/2014 >90  >90 mL/min Final   Comment: (NOTE) The eGFR has been calculated using the CKD EPI equation. This calculation has not been validated in all clinical situations. eGFR's persistently <90 mL/min signify possible Chronic Kidney Disease.   . Anion gap 06/29/2014 7  5 - 15 Final  . WBC 06/30/2014 10.7* 4.0 - 10.5 K/uL Final  . RBC 06/30/2014 3.60* 4.22 - 5.81 MIL/uL Final  . Hemoglobin 06/30/2014 10.5* 13.0 - 17.0 g/dL Final  . HCT 06/30/2014 31.2* 39.0 - 52.0 % Final  . MCV 06/30/2014 86.7  78.0 - 100.0 fL Final  . MCH 06/30/2014 29.2  26.0 - 34.0 pg Final  . MCHC 06/30/2014 33.7  30.0 - 36.0 g/dL  Final  . RDW 06/30/2014 13.0  11.5 - 15.5 % Final  . Platelets 06/30/2014 270  150 - 400 K/uL Final  . Sodium 06/30/2014 139  135 - 145 mmol/L Final   Please note change in reference range.  . Potassium 06/30/2014 3.5  3.5 - 5.1 mmol/L Final   Please note change in reference range.  . Chloride 06/30/2014 107  96 - 112 mEq/L Final  . CO2 06/30/2014 25  19 - 32 mmol/L Final  . Glucose, Bld 06/30/2014 96  70 - 99 mg/dL Final  . BUN 06/30/2014 10  6 - 23 mg/dL Final  . Creatinine, Ser 06/30/2014 0.68  0.50 - 1.35 mg/dL Final  . Calcium 06/30/2014 8.4  8.4 - 10.5 mg/dL Final  . GFR calc non Af Amer 06/30/2014 >90  >90 mL/min Final  . GFR calc Af Amer  06/30/2014 >90  >90 mL/min Final   Comment: (NOTE) The eGFR has been calculated using the CKD EPI equation. This calculation has not been validated in all clinical situations. eGFR's persistently <90 mL/min signify possible Chronic Kidney Disease.   Georgiann Hahn gap 06/30/2014 7  5 - 15 Final  Hospital Outpatient Visit on 06/24/2014  Component Date Value Ref Range Status  . MRSA, PCR 06/24/2014 NEGATIVE  NEGATIVE Final  . Staphylococcus aureus 06/24/2014 NEGATIVE  NEGATIVE Final   Comment:        The Xpert SA Assay (FDA approved for NASAL specimens in patients over 69 years of age), is one component of a comprehensive surveillance program.  Test performance has been validated by [facility] for patients greater than or equal to 90 year old. It is not intended to diagnose infection nor to guide or monitor treatment.   Marland Kitchen aPTT 06/24/2014 31  24 - 37 seconds Final  . WBC 06/24/2014 8.3  4.0 - 10.5 K/uL Final  . RBC 06/24/2014 4.91  4.22 - 5.81 MIL/uL Final  . Hemoglobin 06/24/2014 14.4  13.0 - 17.0 g/dL Final  . HCT 06/24/2014 43.0  39.0 - 52.0 % Final  . MCV 06/24/2014 87.6  78.0 - 100.0 fL Final  . MCH 06/24/2014 29.3  26.0 - 34.0 pg Final  . MCHC 06/24/2014 33.5  30.0 - 36.0 g/dL Final  . RDW 06/24/2014 13.2  11.5 - 15.5 % Final  . Platelets 06/24/2014 287  150 - 400 K/uL Final  . Neutrophils Relative % 06/24/2014 55  43 - 77 % Final  . Neutro Abs 06/24/2014 4.5  1.7 - 7.7 K/uL Final  . Lymphocytes Relative 06/24/2014 35  12 - 46 % Final  . Lymphs Abs 06/24/2014 2.9  0.7 - 4.0 K/uL Final  . Monocytes Relative 06/24/2014 8  3 - 12 % Final  . Monocytes Absolute 06/24/2014 0.7  0.1 - 1.0 K/uL Final  . Eosinophils Relative 06/24/2014 2  0 - 5 % Final  . Eosinophils Absolute 06/24/2014 0.2  0.0 - 0.7 K/uL Final  . Basophils Relative 06/24/2014 0  0 - 1 % Final  . Basophils Absolute 06/24/2014 0.0  0.0 - 0.1 K/uL Final  . Sodium 06/24/2014 141  135 - 145 mmol/L Final   Please note  change in reference range.  . Potassium 06/24/2014 3.9  3.5 - 5.1 mmol/L Final   Please note change in reference range.  . Chloride 06/24/2014 108  96 - 112 mEq/L Final  . CO2 06/24/2014 26  19 - 32 mmol/L Final  . Glucose, Bld 06/24/2014 87  70 - 99  mg/dL Final  . BUN 06/24/2014 9  6 - 23 mg/dL Final  . Creatinine, Ser 06/24/2014 0.69  0.50 - 1.35 mg/dL Final  . Calcium 06/24/2014 9.1  8.4 - 10.5 mg/dL Final  . Total Protein 06/24/2014 7.2  6.0 - 8.3 g/dL Final  . Albumin 06/24/2014 4.3  3.5 - 5.2 g/dL Final  . AST 06/24/2014 24  0 - 37 U/L Final  . ALT 06/24/2014 30  0 - 53 U/L Final  . Alkaline Phosphatase 06/24/2014 61  39 - 117 U/L Final  . Total Bilirubin 06/24/2014 0.7  0.3 - 1.2 mg/dL Final  . GFR calc non Af Amer 06/24/2014 >90  >90 mL/min Final  . GFR calc Af Amer 06/24/2014 >90  >90 mL/min Final   Comment: (NOTE) The eGFR has been calculated using the CKD EPI equation. This calculation has not been validated in all clinical situations. eGFR's persistently <90 mL/min signify possible Chronic Kidney Disease.   . Anion gap 06/24/2014 7  5 - 15 Final  . Prothrombin Time 06/24/2014 12.3  11.6 - 15.2 seconds Final  . INR 06/24/2014 0.90  0.00 - 1.49 Final  . ABO/RH(D) 06/24/2014 B POS   Final  . Antibody Screen 06/24/2014 NEG   Final  . Sample Expiration 06/24/2014 07/01/2014   Final  . Color, Urine 06/24/2014 YELLOW  YELLOW Final  . APPearance 06/24/2014 CLEAR  CLEAR Final  . Specific Gravity, Urine 06/24/2014 1.005  1.005 - 1.030 Final  . pH 06/24/2014 7.0  5.0 - 8.0 Final  . Glucose, UA 06/24/2014 NEGATIVE  NEGATIVE mg/dL Final  . Hgb urine dipstick 06/24/2014 NEGATIVE  NEGATIVE Final  . Bilirubin Urine 06/24/2014 NEGATIVE  NEGATIVE Final  . Ketones, ur 06/24/2014 NEGATIVE  NEGATIVE mg/dL Final  . Protein, ur 06/24/2014 NEGATIVE  NEGATIVE mg/dL Final  . Urobilinogen, UA 06/24/2014 0.2  0.0 - 1.0 mg/dL Final  . Nitrite 06/24/2014 NEGATIVE  NEGATIVE Final  . Leukocytes,  UA 06/24/2014 NEGATIVE  NEGATIVE Final   MICROSCOPIC NOT DONE ON URINES WITH NEGATIVE PROTEIN, BLOOD, LEUKOCYTES, NITRITE, OR GLUCOSE <1000 mg/dL.  . ABO/RH(D) 06/24/2014 B POS   Final     X-Rays:Dg Chest 2 View  06/24/2014   CLINICAL DATA:  Hypertension, smoker, preoperative clearance for total hip arthroplasty  EXAM: CHEST  2 VIEW  COMPARISON:  None  FINDINGS: Upper normal heart size.  Normal mediastinal contours and pulmonary vascularity.  Question RIGHT nipple shadow.  Lungs otherwise clear.  No pleural effusion or pneumothorax.  Bones unremarkable.  IMPRESSION: Question RIGHT nipple shadow ; repeat PA chest radiograph with nipple markers recommended to exclude pulmonary nodule.  Otherwise negative exam.   Electronically Signed   By: Lavonia Dana M.D.   On: 06/24/2014 17:36   Dg Chest Port 1 View  06/29/2014   CLINICAL DATA:  Hypertension.  EXAM: PORTABLE CHEST - 1 VIEW  COMPARISON:  June 24, 2014  FINDINGS: Current study was obtained with nipple markers in place. The previously noted nodular opacity in the right lower lobe is not appreciable on this study. Due to differences in position and technique, it is difficult to confirm that the previous opacity indeed represented a nipple shadow. On the current examination, there is no edema or consolidation. Heart size and pulmonary vascularity are within normal limits. No adenopathy. There is atherosclerotic change in the aorta.  IMPRESSION: No edema or consolidation. No nodular opacity seen currently in right base region.   Electronically Signed   By: Lowella Grip  M.D.   On: 06/29/2014 07:19   Dg Hip Port Unilat With Pelvis 1v Right  06/28/2014   CLINICAL DATA:  59 year old male post right hip arthroplasty. Initial encounter.  EXAM: DG HIP W/ PELVIS 1V PORT*R*  COMPARISON:  04/08/2014.  FINDINGS: Post right hip replacement which appears in satisfactory position on this single projection without complication noted. This can be assessed on followup  two view exam.  IMPRESSION: Post right hip replacement which appears in satisfactory position on this single projection without complication noted. This can be assessed on followup two view exam.   Electronically Signed   By: Chauncey Cruel M.D.   On: 06/28/2014 15:32    EKG: Orders placed or performed in visit on 06/16/14  . EKG 12-Lead     Hospital Course: Patient was admitted to [facility] and taken to the OR and underwent the above state procedure without complications.  Patient tolerated the procedure well and was later transferred to the recovery room and then to the orthopaedic floor for postoperative care.  They were given PO and IV analgesics for pain control following their surgery.  They were given 24 hours of postoperative antibiotics of  Anti-infectives    Start     Dose/Rate Route Frequency Ordered Stop   06/28/14 1800  ceFAZolin (ANCEF) IVPB 1 g/50 mL premix     1 g100 mL/hr over 30 Minutes Intravenous Every 6 hours 06/28/14 1652 06/29/14 0022   06/28/14 1326  polymyxin B 500,000 Units, bacitracin 50,000 Units in sodium chloride irrigation 0.9 % 500 mL irrigation  Status:  Discontinued       As needed 06/28/14 1326 06/28/14 1501   06/28/14 1047  ceFAZolin (ANCEF) IVPB 2 g/50 mL premix     2 g100 mL/hr over 30 Minutes Intravenous On call to O.R. 06/28/14 1047 06/28/14 1307     and started on DVT prophylaxis in the form of Xarelto.   PT and OT were ordered for total hip protocol.  The patient was allowed to be PWB 50% with therapy. Discharge planning was consulted to help with postop disposition and equipment needs.  Patient had a good night on the evening of surgery.  They started to get up OOB with therapy on day one.  Hemovac drain was pulled without difficulty.  The knee immobilizer was removed and discontinued.  Continued to work with therapy into day two.  Dressing was changed on day two and the incision was clean and dry. The patient had progressed with therapy and  meeting their goals.  Incision was healing well.  Patient was seen in rounds and was ready to go home.   Diet: Regular diet Activity:PWB No bending hip over 90 degrees- A "L" Angle Do not cross legs Do not let foot roll inward When turning these patients a pillow should be placed between the patient's legs to prevent crossing. Patients should have the affected knee fully extended when trying to sit or stand from all surfaces to prevent excessive hip flexion. When ambulating and turning toward the affected side the affected leg should have the toes turned out prior to moving the walker and the rest of patient's body as to prevent internal rotation/ turning in of the leg. Abduction pillows are the most effective way to prevent a patient from not crossing legs or turning toes in at rest. If an abduction pillow is not ordered placing a regular pillow length wise between the patient's legs is also an effective reminder. It is imperative  that these precautions be maintained so that the surgical hip does not dislocate. Follow-up:in 2 weeks Disposition - Home Discharged Condition: stable   Discharge Instructions    Call MD / Call 911    Complete by:  As directed   If you experience chest pain or shortness of breath, CALL 911 and be transported to the hospital emergency room.  If you develope a fever above 101 F, pus (white drainage) or increased drainage or redness at the wound, or calf pain, call your surgeon's office.     Change dressing    Complete by:  As directed   You may change your dressing daily with sterile 4 x 4 inch gauze dressing and paper tape.     Constipation Prevention    Complete by:  As directed   Drink plenty of fluids.  Prune juice may be helpful.  You may use a stool softener, such as Colace (over the counter) 100 mg twice a day.  Use MiraLax (over the counter) for constipation as needed.     Diet general    Complete by:  As directed      Discharge instructions    Complete  by:  As directed   Walk with your walker. Partial weightbearing 50% right leg Home Health Agency will follow you at home for your therapy  Change your dressing daily. Shower only, no tub bath. Call if any temperatures greater than 101 or any wound complications: [phone] during the day and ask for Dr. Charlestine Night nurse, Brunilda Payor.     Driving restrictions    Complete by:  As directed   No driving     Follow the hip precautions as taught in Physical Therapy    Complete by:  As directed      Increase activity slowly as tolerated    Complete by:  As directed             Medication List    STOP taking these medications        meloxicam 15 MG tablet  Commonly known as:  MOBIC      TAKE these medications        amLODipine 5 MG tablet  Commonly known as:  NORVASC  Take 1 tablet (5 mg total) by mouth daily.     docusate sodium 100 MG capsule  Commonly known as:  COLACE  Take 1 capsule (100 mg total) by mouth 2 (two) times daily.     ferrous sulfate 325 (65 FE) MG tablet  Take 1 tablet (325 mg total) by mouth 3 (three) times daily after meals.     HYDROcodone-acetaminophen 5-325 MG per tablet  Commonly known as:  NORCO/VICODIN  Take 1-2 tablets by mouth every 4 (four) hours as needed (breakthrough pain).     methocarbamol 500 MG tablet  Commonly known as:  ROBAXIN  Take 1 tablet (500 mg total) by mouth every 6 (six) hours as needed for muscle spasms.     rivaroxaban 10 MG Tabs tablet  Commonly known as:  XARELTO  Take 1 tablet (10 mg total) by mouth daily with breakfast.           Follow-up Information    Follow up with [facility]   Why:  home health physical and occupational therapy   Contact information:   [address] [place] [address] [phone]       Follow up with [facility].   Why:  3n1 (  commode) and rolling walker   Contact information:   [address] [place]  [address] [phone]         Follow up with GIOFFRE,RONALD A, MD. Schedule an appointment as soon as possible for a visit in 2 weeks.   Specialty:  Orthopedic Surgery   Contact information:   [address] [place] [address] [phone]       Signed: Ardeen Jourdain, PA-C Orthopaedic Surgery 07/05/2014, 6:57 AM

## 2014-08-12 ENCOUNTER — Ambulatory Visit (INDEPENDENT_AMBULATORY_CARE_PROVIDER_SITE_OTHER): Payer: BLUE CROSS/BLUE SHIELD | Admitting: Family

## 2014-08-12 ENCOUNTER — Encounter: Payer: Self-pay | Admitting: Family

## 2014-08-12 ENCOUNTER — Telehealth: Payer: Self-pay | Admitting: Family

## 2014-08-12 ENCOUNTER — Ambulatory Visit (INDEPENDENT_AMBULATORY_CARE_PROVIDER_SITE_OTHER)
Admission: RE | Admit: 2014-08-12 | Discharge: 2014-08-12 | Disposition: A | Discharge status: Home / Self Care | Payer: BLUE CROSS/BLUE SHIELD | Source: Ambulatory Visit | Attending: Family | Admitting: Family

## 2014-08-12 VITALS — BP 172/102 | HR 76 | Temp 98.1°F | Resp 18 | Wt 111.0 lb

## 2014-08-12 DIAGNOSIS — M2550 Pain in unspecified joint: Secondary | ICD-10-CM | POA: Insufficient documentation

## 2014-08-12 DIAGNOSIS — I10 Essential (primary) hypertension: Secondary | ICD-10-CM

## 2014-08-12 MED ORDER — PREDNISONE 10 MG PO TABS: ORAL_TABLET | ORAL | Status: DC

## 2014-08-12 MED ORDER — AMLODIPINE BESYLATE 5 MG PO TABS: 5.0000 mg | ORAL_TABLET | Freq: Every day | ORAL | Status: DC

## 2014-08-12 MED ORDER — MELOXICAM 15 MG PO TABS: 15.0000 mg | ORAL_TABLET | Freq: Every day | ORAL | Status: DC

## 2014-08-12 NOTE — Telephone Encounter (Signed)
Tried calling pt was unable to leave a VM will try again later

## 2014-08-12 NOTE — Progress Notes (Signed)
   Subjective:    Patient ID: Derek Rollins T Bracknell, male    DOB: [birth year], 59 y.o.   MRN: [id number]  Chief Complaint  Patient presents with  . Follow-up    having lower back pain, had surgery 33 years ago on his back, when sitting for a long period of time it starts hurting bad    HPI:  Derek Rollins is a 59 y.o. male who presents today for low back pain. His translators was present for today's visit.   1) Arthralgia - Associated symptoms of joint pains located shoulder, back, knees and feet that has been going on for about 2 weeks and is located primarily on the left side since he discontinued physical therapy from his right hip surgery. He was previously maintained on pain medication from orthopedics. Pain intensity is rated 8-9/10 and has been strong enough to effect his sleep. He ambulates with a walker at home. Has notes that the back pain is increased. He currently ambulates with a cane.    Past Medical History  Diagnosis Date  . Hypertension   . Arthritis     Review of Systems  Constitutional: Negative for fever and chills.  Musculoskeletal: Positive for back pain and arthralgias.  Neurological: Negative for numbness.      Objective:    BP 172/102 mmHg  Pulse 76  Temp(Src) 98.1 F (36.7 C) (Oral)  Resp 18  Wt 111 lb (50.349 kg)  SpO2 99% Nursing note and vital signs reviewed.  Physical Exam  Constitutional: He is oriented to person, place, and time. He appears well-developed and well-nourished. No distress.  Cardiovascular: Normal rate, regular rhythm, normal heart sounds and intact distal pulses.   Pulmonary/Chest: Effort normal and breath sounds normal.  Musculoskeletal:  Low back: Previous surgical scar noted. No obvious deformity, discoloration, or edema of lower back noted. Tenderness elicited along the lower thoracic and lumbar spine and paraspinal musculature. Patient displays decreased flexion and rotation. Straight leg raises negative.  Neck/UE: No obvious  deformity, discoloration, or edema of the neck or upper extremity noted. Distal pulses and sensation are intact and appropriate. Range of motion is normal.  Neurological: He is alert and oriented to person, place, and time.  Skin: Skin is warm and dry.  Psychiatric: He has a normal mood and affect. His behavior is normal. Judgment and thought content normal.       Assessment & Plan:

## 2014-08-12 NOTE — Assessment & Plan Note (Signed)
Patient indicates multiple arthralgias of his left upper extremity and neck. After evaluation of gait with his cane, arthralgias are likely secondary to his poor mechanics. Recommend changing to a walker for ambulation. Start prednisone as needed for inflammation. Start meloxicam as needed for pain. Follow up with orthopedics if this does not improve. Poor mechanics is also likely the cause of his increased back pain. We will obtain lumbar x-ray to rule out any other underlying pathology.

## 2014-08-12 NOTE — Progress Notes (Signed)
Pre visit review using our clinic review tool, if applicable. No additional management support is needed unless otherwise documented below in the visit note. 

## 2014-08-12 NOTE — Telephone Encounter (Signed)
Please inform the patient that his x-rays show no fracture, however there is some degenerative disk disease which may possibly explain some of the back pain.

## 2014-08-12 NOTE — Patient Instructions (Addendum)
Please start the prednisone and mobic as needed for inflammation. Please ensure you are NOT taking the Xarelto (Rivaroxiban).  Please use your walker and stop using your cane which is most likely the cause of your pain.   Walker Use HOW TO TELL IF A WALKER IS THE RIGHT SIZE  With your arms hanging at your sides, the walker handles should be at wrist level. If you cannot find the exact fit, choose the height that is most comfortable.  If you have been instructed to not place weight on one of your legs, you may feel more comfortable with a shorter height. If you are using the walker for balance, you may prefer a taller height.  Adjust the height by using the push buttons on the legs of your walker.  In rest position, the back leg of the walkers should be no further ahead than your toes. With your hands resting on the grips, your elbows should be slightly bent at about a 30 degree angle.  Ask your physical therapist or caregiver if you have any concerns. HOW TO USE A STANDARD WALKER (NO WHEELS)  Pick your walker up (do not slide your walker) and place it one step length in front of you. The back legs of the walker should be no further ahead than your toes. You should not feel like you need to lean forward to keep your hands on the grips. As you set the walker down, make sure all 4 leg tips contact the ground at the same time.  Hold onto the walker for support and step forward with your weaker leg into the middle of the walker. Follow the weight bearing instructions your caregiver has given you.  Push down with your hands and step forward with your stronger leg.  Be careful not to let the walker get too far ahead of you as you walk.  Repeat the process for each step. HOW TO USE A FRONT-WHEELED WALKER  Slide your walker forward. The back legs of the walker should be no further ahead than your toes. You should not feel like you need to lean forward to keep your hands on the grips.  Hold onto  the walker for support and step forward with your weaker leg into the middle of the walker. Follow the weight bearing instructions your caregiver has given you.  Push down with your hands and step forward with your stronger leg.  Be careful not to let the walker get too far ahead of you as you walk.  Repeat the process for each step.  If your walker does not glide well over carpet, consider cutting an "x" in 2 old tennis balls and placing them over the back legs of your walker. STANDING UP FROM A CHAIR WITH ARMRESTS  It is best to sit in a firm chair with armrests.  Position your walker directly in front of your chair. Do not pull on the walker when standing up. It is too unstable to support weight when pulled on.  Slide forward in the chair, with your weaker leg ahead and stronger leg bent near the chair.  Lean forward and push up from your chair with both hands on the armrests. Straighten your stronger leg, rising to standing. Do not pull yourself up from the walker. This may cause it to tip.  When you feel steady on your feet, carefully move one hand at a time to the walker.  Stand for a few seconds to stabilize your balance before you  start to walk. STANDING UP FROM A CHAIR WITHOUT ARMRESTS  It is best to sit in a firm chair. A low seat or an overstuffed chair or sofa is hard to get out of.  Place the walker in front of you. Do not pull on the walker when coming to a standing position.  Slide forward in the chair, with your weaker leg ahead and stronger leg bent near the chair.  Push down on the chair seat with the hand opposite your weaker leg. Keep your other hand on the center of the walker's crossbar.  Stand, steady your balance, and place your hands on the walker handgrips. SITTING DOWN  Always back up toward your chair, using your walker, until you feel the back of your legs touch the chair.  If the chair has armrests, carefully reach back to put your hands on the  armrests, and slowly lower your weight.  If the chair does not have armrests, consider backing up to the side of the chair. You can then hold onto the back of the chair and the front of the seat to slowly lower yourself.  You should never feel like you are falling into your chair. USING A WALKER ON STEPS   Before attempting to use your walker on steps, practice with your physical therapist.  If you are going up a step wide enough to accommodate the entire walker and yourself:  First, place the walker up on the step.  Second, get your feet as close to the step as you can.  Third, press down on the walker with your hands as you step up with your stronger leg. Then step up with your weaker leg.  If you are going down a step wide enough to accommodate the entire walker and yourself:  First, place the walker down on the step.  Second, hold onto the walker as you step down with your weaker leg. Then step down with your stronger leg.  If you are going up more than 1 step and have a railing:  First, turn the walker sideways, so the opening is facing in toward you.  Second, place the front 2 legs of the walker on the first step. These front legs should be positioned at the base of the next step.  Third, test the steadiness of the walker. It should feel sturdy when you press down on the handgrip that is facing the top of the steps.  Finally, placing your weight on the railing and the walker, step up with your stronger leg first. Then step up with your weaker leg.  If you are going down more than 1 step and have a railing:  First, turn the walker sideways, so the opening is facing in toward you.  Second, place the front 2 legs of the walker down on the first step. When possible, the back legs of the walker should be positioned at the base of the previous step.  Third, test the steadiness of the walker. It should feel sturdy when you press down on the handgrip that is facing the top of the  steps.  Finally, placing your weight on the railing and the walker, step down with your weaker leg first. Then step down with your stronger leg.  Be sure to check the sturdiness of the walker before each step.  Make sure you have good rubber tips on the legs of your walker to prevent it from slipping. Document Released: 05/28/2005 Document Revised: 08/20/2011 Document Reviewed: 12/05/2010 [employer] Patient  Information 2015 [employer]. This information is not intended to replace advice given to you by your health care provider. Make sure you discuss any questions you have with your health care provider.  

## 2014-08-13 NOTE — Telephone Encounter (Signed)
Pt aware of results 

## 2014-09-10 ENCOUNTER — Other Ambulatory Visit: Payer: Self-pay | Admitting: Surgical

## 2014-09-10 NOTE — Care Management (Signed)
Pre-surgical orders entered on 09/10/2014 at 8:30am

## 2014-09-15 ENCOUNTER — Other Ambulatory Visit (HOSPITAL_COMMUNITY): Payer: Self-pay | Admitting: *Deleted

## 2014-09-15 NOTE — Progress Notes (Signed)
1 view chest xray 06-29-14 epic ekg 06-29-14 epic Interpreter email confirmation vietnamese interpreter to arrive [id number] am 09-14-14 [facility] short stay

## 2014-09-15 NOTE — Patient Instructions (Addendum)
Derek Rollins  09/15/2014   Your procedure is scheduled on: 09-24-14 Friday  [facility]  [facility]tr[facility] and follow signs to               Short Stay Center at 515  AM.  Call this number if you have problems the morning of surgery [phone]   Remember:  Do not eat food or drink liquids :After Midnight.                                You may not have any metal on your body including hair pins and              piercings  Do not wear jewelry, make-up, lotions, powders or perfumes.             Do not wear nail polish.  Do not shave  48 hours prior to surgery.              Men may shave face and neck.  Do not bring valuables to the hospital. [facility] IS NOT             RESPONSIBLE   FOR VALUABLES.  Contacts, dentures or bridgework may not be worn into surgery.  Leave suitcase in the car. After surgery it may be brought to your room.               Please read over the following fact sheets you were given: MRSA information _____________________________________________________________________             [facility] - Preparing for Surgery Before surgery, you can play an important role.  Because skin is not sterile, your skin needs to be as free of germs as possible.  You can reduce the number of germs on your skin by washing with CHG (chlorahexidine gluconate) soap before surgery.  CHG is an antiseptic cleaner which kills germs and bonds with the skin to continue killing germs even after washing. Please DO NOT use if you have an allergy to CHG or antibacterial soaps.  If your skin becomes reddened/irritated stop using the CHG and inform your nurse when you arrive at Short Stay. Do not shave (including legs and underarms) for at least 48 hours prior to the first CHG shower.  You may shave your face/neck. Please follow these instructions carefully:  1.  Shower with CHG Soap the night before surgery and the  morning of Surgery.  2.  If you choose to wash your  hair, wash your hair first as usual with your  normal  shampoo.  3.  After you shampoo, rinse your hair and body thoroughly to remove the  shampoo.                            4.  Use CHG as you would any other liquid soap.  You can apply chg directly  to the skin and wash                       Gently with a scrungie or clean washcloth.  5.  Apply the CHG Soap to your body ONLY FROM THE NECK DOWN.   Do not use on face/ open  Wound or open sores. Avoid contact with eyes, ears mouth and genitals (private parts).                       Wash face,  Genitals (private parts) with your normal soap.             6.  Wash thoroughly, paying special attention to the area where your surgery  will be performed.  7.  Thoroughly rinse your body with warm water from the neck down.  8.  DO NOT shower/wash with your normal soap after using and rinsing off  the CHG Soap.                9.  Pat yourself dry with a clean towel.            10.  Wear clean pajamas.            11.  Place clean sheets on your bed the night of your first shower and do not  sleep with pets. Day of Surgery : Do not apply any lotions/deodorants the morning of surgery.  Please wear clean clothes to the hospital/surgery center.  FAILURE TO FOLLOW THESE INSTRUCTIONS MAY RESULT IN THE CANCELLATION OF YOUR SURGERY PATIENT SIGNATURE_________________________________  NURSE SIGNATURE__________________________________  ________________________________________________________________________   [name]  An incentive spirometer is a tool that can help keep your lungs clear and active. This tool measures how well you are filling your lungs with each breath. Taking long deep breaths may help reverse or decrease the chance of developing breathing (pulmonary) problems (especially infection) following:  A long period of time when you are unable to move or be active. BEFORE THE PROCEDURE   If the spirometer  includes an indicator to show your best effort, your nurse or respiratory therapist will set it to a desired goal.  If possible, sit up straight or lean slightly forward. Try not to slouch.  Hold the incentive spirometer in an upright position. INSTRUCTIONS FOR USE  1. Sit on the edge of your bed if possible, or sit up as far as you can in bed or on a chair. 2. Hold the incentive spirometer in an upright position. 3. Breathe out normally. 4. Place the mouthpiece in your mouth and seal your lips tightly around it. 5. Breathe in slowly and as deeply as possible, raising the piston or the ball toward the top of the column. 6. Hold your breath for 3-5 seconds or for as long as possible. Allow the piston or ball to fall to the bottom of the column. 7. Remove the mouthpiece from your mouth and breathe out normally. 8. Rest for a few seconds and repeat Steps 1 through 7 at least 10 times every 1-2 hours when you are awake. Take your time and take a few normal breaths between deep breaths. 9. The spirometer may include an indicator to show your best effort. Use the indicator as a goal to work toward during each repetition. 10. After each set of 10 deep breaths, practice coughing to be sure your lungs are clear. If you have an incision (the cut made at the time of surgery), support your incision when coughing by placing a pillow or rolled up towels firmly against it. Once you are able to get out of bed, walk around indoors and cough well. You may stop using the incentive spirometer when instructed by your caregiver.  RISKS AND COMPLICATIONS  Take your time so you do not get  dizzy or light-headed.  If you are in pain, you may need to take or ask for pain medication before doing incentive spirometry. It is harder to take a deep breath if you are having pain. AFTER USE  Rest and breathe slowly and easily.  It can be helpful to keep track of a log of your progress. Your caregiver can provide you with a  simple table to help with this. If you are using the spirometer at home, follow these instructions: [place] IF:   You are having difficultly using the spirometer.  You have trouble using the spirometer as often as instructed.  Your pain medication is not giving enough relief while using the spirometer.  You develop fever of 100.5 F (38.1 C) or higher. SEEK IMMEDIATE MEDICAL CARE IF:   You cough up bloody sputum that had not been present before.  You develop fever of 102 F (38.9 C) or greater.  You develop worsening pain at or near the incision site. MAKE SURE YOU:   Understand these instructions.  Will watch your condition.  Will get help right away if you are not doing well or get worse. Document Released: 10/08/2006 Document Revised: 08/20/2011 Document Reviewed: 12/09/2006 [facility] Patient Information 2014 [place], [place].   ________________________________________________________________________

## 2014-09-16 ENCOUNTER — Encounter (HOSPITAL_COMMUNITY): Payer: Self-pay

## 2014-09-16 ENCOUNTER — Ambulatory Visit (HOSPITAL_COMMUNITY)
Admission: RE | Admit: 2014-09-16 | Discharge: 2014-09-16 | Disposition: A | Discharge status: Home / Self Care | Payer: BLUE CROSS/BLUE SHIELD | Source: Ambulatory Visit | Attending: Surgical | Admitting: Surgical

## 2014-09-16 ENCOUNTER — Encounter (HOSPITAL_COMMUNITY)
Admission: RE | Admit: 2014-09-16 | Discharge: 2014-09-16 | Disposition: A | Discharge status: Home / Self Care | Payer: BLUE CROSS/BLUE SHIELD | Source: Ambulatory Visit | Attending: Orthopedic Surgery | Admitting: Orthopedic Surgery

## 2014-09-16 DIAGNOSIS — M545 Low back pain, unspecified: Secondary | ICD-10-CM

## 2014-09-16 DIAGNOSIS — M5136 Other intervertebral disc degeneration, lumbar region: Secondary | ICD-10-CM | POA: Insufficient documentation

## 2014-09-16 LAB — CBC WITH DIFFERENTIAL/PLATELET
Basophils Absolute: 0 10*3/uL (ref 0.0–0.1)
Basophils Relative: 1 % (ref 0–1)
Eosinophils Absolute: 0.2 10*3/uL (ref 0.0–0.7)
Eosinophils Relative: 2 % (ref 0–5)
HCT: 44.9 % (ref 39.0–52.0)
Hemoglobin: 15.1 g/dL (ref 13.0–17.0)
Lymphocytes Relative: 33 % (ref 12–46)
Lymphs Abs: 2.5 10*3/uL (ref 0.7–4.0)
MCH: 28.9 pg (ref 26.0–34.0)
MCHC: 33.6 g/dL (ref 30.0–36.0)
MCV: 85.9 fL (ref 78.0–100.0)
Monocytes Absolute: 0.9 10*3/uL (ref 0.1–1.0)
Monocytes Relative: 12 % (ref 3–12)
Neutro Abs: 4 10*3/uL (ref 1.7–7.7)
Neutrophils Relative %: 52 % (ref 43–77)
Platelets: 327 10*3/uL (ref 150–400)
RBC: 5.23 MIL/uL (ref 4.22–5.81)
RDW: 13.6 % (ref 11.5–15.5)
WBC: 7.6 10*3/uL (ref 4.0–10.5)

## 2014-09-16 LAB — URINALYSIS, ROUTINE W REFLEX MICROSCOPIC
Bilirubin Urine: NEGATIVE
Glucose, UA: NEGATIVE mg/dL
Hgb urine dipstick: NEGATIVE
Ketones, ur: NEGATIVE mg/dL
Leukocytes, UA: NEGATIVE
Nitrite: NEGATIVE
Protein, ur: NEGATIVE mg/dL
Specific Gravity, Urine: 1.004 — ABNORMAL LOW (ref 1.005–1.030)
Urobilinogen, UA: 0.2 mg/dL (ref 0.0–1.0)
pH: 6 (ref 5.0–8.0)

## 2014-09-16 LAB — COMPREHENSIVE METABOLIC PANEL
ALT: 29 U/L (ref 0–53)
AST: 26 U/L (ref 0–37)
Albumin: 4.1 g/dL (ref 3.5–5.2)
Alkaline Phosphatase: 75 U/L (ref 39–117)
Anion gap: 10 (ref 5–15)
BUN: 12 mg/dL (ref 6–23)
CO2: 23 mmol/L (ref 19–32)
Calcium: 9.1 mg/dL (ref 8.4–10.5)
Chloride: 106 mmol/L (ref 96–112)
Creatinine, Ser: 0.67 mg/dL (ref 0.50–1.35)
GFR calc Af Amer: >90 mL/min (ref >90)
GFR calc non Af Amer: >90 mL/min (ref >90)
Glucose, Bld: 100 mg/dL — ABNORMAL HIGH (ref 70–99)
Potassium: 4.2 mmol/L (ref 3.5–5.1)
Sodium: 139 mmol/L (ref 135–145)
Total Bilirubin: 0.3 mg/dL (ref 0.3–1.2)
Total Protein: 7.2 g/dL (ref 6.0–8.3)

## 2014-09-16 LAB — SURGICAL PCR SCREEN
MRSA, PCR: NEGATIVE
STAPHYLOCOCCUS AUREUS: NEGATIVE

## 2014-09-16 LAB — PROTIME-INR
INR: 0.91 (ref 0.00–1.49)
Prothrombin Time: 12.3 seconds (ref 11.6–15.2)

## 2014-09-21 ENCOUNTER — Other Ambulatory Visit: Payer: Self-pay | Admitting: Orthopedic Surgery

## 2014-09-21 DIAGNOSIS — I714 Abdominal aortic aneurysm, without rupture, unspecified: Secondary | ICD-10-CM

## 2014-09-21 NOTE — Progress Notes (Signed)
Spoke with dr Windy Fastronald gioffre aware of back xray results faxed on 09-16-2014, patient it to get abdominal ultrasound this week priot to surgery per dr Ranee Gosselinronald gioffre.

## 2014-09-21 NOTE — Progress Notes (Signed)
Left message for Derek Rollins pts lumbar spine xrays were faxed to dr Darrelyn Hillockgioffre on 09-15-13 and abdomial aortic ectasia and/or aneurysm cannor be excluded. Aortic ultrasound suggested for further evaluation.

## 2014-09-23 NOTE — H&P (Signed)
Derek Rollins is an [age] y.o. male.   Chief Complaint: low back pain with left leg pain and weakness  HPI: Fuller Songhong presented with the chief complaint of low back pain and left leg pain. He started having trouble with this about two months ago. He had a right total hip arthroplasty 3 months ago and was doing well until he became limited by his left LE. He experienced pain and weakness in the left LE, developing a left foot drop. MRI showed lumbar spinal stenosis with disc herniation at L4-L5 on the left.    Past Medical History  Diagnosis Date  . Hypertension   . Arthritis     Past Surgical History  Procedure Laterality Date  . Lumbar disc surgery  23 years ago  . Total hip arthroplasty Right 06/28/2014    Procedure: RIGHT TOTAL HIP ARTHROPLASTY;  Surgeon: Jacki Conesonald A Gioffre, MD;  Location: [facility] ORS;  Service: Orthopedics;  Laterality: Right;    Social History:  reports that he has been smoking Cigarettes.  He has a 8 pack-year smoking history. He has never used smokeless tobacco. He reports that he drinks alcohol. He reports that he does not use illicit drugs.  Allergies:  Allergies  Allergen Reactions  . Aspirin     Face and mouth numbness      Current outpatient prescriptions:  .  amLODipine (NORVASC) 5 MG tablet, Take 1 tablet (5 mg total) by mouth daily. (Patient taking differently: Take 5 mg by mouth at bedtime. ), Disp: 30 tablet, Rfl: 5 .  ferrous sulfate 325 (65 FE) MG tablet, Take 1 tablet (325 mg total) by mouth 3 (three) times daily after meals., Disp: 30 tablet, Rfl: 0 .  HYDROcodone-acetaminophen (NORCO/VICODIN) 5-325 MG per tablet, Take 1-2 tablets by mouth every 4 (four) hours as needed (breakthrough pain)., Disp: 80 tablet, Rfl: 0 .  meloxicam (MOBIC) 15 MG tablet, Take 1 tablet (15 mg total) by mouth daily., Disp: 30 tablet, Rfl: 0 .  methocarbamol (ROBAXIN) 500 MG tablet, Take 1 tablet (500 mg total) by mouth every 6 (six) hours as needed for muscle spasms., Disp: 60 tablet,  Rfl: 0   Review of Systems  Constitutional: Negative for fever, chills, weight loss, malaise/fatigue and diaphoresis.  HENT: Negative.   Eyes: Negative.   Respiratory: Negative.   Cardiovascular: Negative.   Gastrointestinal: Negative.   Genitourinary: Negative.   Musculoskeletal: Positive for back pain and joint pain. Negative for myalgias, falls and neck pain.  Skin: Negative.   Neurological: Positive for sensory change and weakness. Negative for dizziness, tingling, tremors, speech change, focal weakness, seizures and loss of consciousness.  Psychiatric/Behavioral: Negative.     Vitals Weight: 110 lb Height: 63.5in Body Surface Area: 1.51 m Body Mass Index: 19.18 kg/m  Pulse: 88 (Regular)  BP: 146/83 (Sitting, Left Arm, Standard)   Physical Exam  Constitutional: He is oriented to person, place, and time. He appears well-developed and well-nourished. No distress.  HENT:  Head: Normocephalic and atraumatic.  Right Ear: External ear normal.  Left Ear: External ear normal.  Nose: Nose normal.  Mouth/Throat: Oropharynx is clear and moist.  Eyes: Conjunctivae and EOM are normal.  Neck: Normal range of motion. Neck supple.  Cardiovascular: Normal rate, regular rhythm, normal heart sounds and intact distal pulses.   No murmur heard. Respiratory: Effort normal and breath sounds normal. No respiratory distress. He has no wheezes.  GI: Soft. Bowel sounds are normal. He exhibits no distension. There is no tenderness.  Musculoskeletal:  Right hip: Normal.       Left hip: Normal.       Right knee: Normal.       Left knee: Normal.       Lumbar back: He exhibits pain and spasm.  Pain with motion of the lumbar spine that causes radiation of pain through the left LE  Neurological: He is alert and oriented to person, place, and time. He has normal reflexes. A sensory deficit is present.  Decreased sensation to light touch along the L5 nerve distribution. Foot drop on the  left side due to weakness in EHL and dorsiflexors of the left foot  Skin: No rash noted. He is not diaphoretic. No erythema.  Psychiatric: He has a normal mood and affect. His behavior is normal.     Assessment/Plan Lumbar spinal stenosis and disc herniation L4-L5 left He needs a central decompressive lumbar laminectomy with microdiscectomy L4-L5 on the left. Risks and benefits of the surgery discussed with the patient and his interpreter by Dr. Ranee Gosselin.   H&P performed by Dr. Darrelyn Hillock Documented by Dimitri Ped, PA-C  Tailyn Hantz Leotis Shames 09/23/2014, 9:47 PM

## 2014-09-24 ENCOUNTER — Encounter (HOSPITAL_COMMUNITY): Payer: Self-pay | Admitting: *Deleted

## 2014-09-24 ENCOUNTER — Ambulatory Visit (HOSPITAL_COMMUNITY): Payer: BLUE CROSS/BLUE SHIELD

## 2014-09-24 ENCOUNTER — Encounter (HOSPITAL_COMMUNITY)
Admission: RE | Disposition: A | Discharge status: Home / Self Care | Payer: Self-pay | Source: Ambulatory Visit | Attending: Orthopedic Surgery

## 2014-09-24 ENCOUNTER — Ambulatory Visit (HOSPITAL_COMMUNITY): Payer: BLUE CROSS/BLUE SHIELD | Admitting: Anesthesiology

## 2014-09-24 ENCOUNTER — Observation Stay (HOSPITAL_COMMUNITY): Payer: BLUE CROSS/BLUE SHIELD

## 2014-09-24 ENCOUNTER — Observation Stay (HOSPITAL_COMMUNITY)
Admission: RE | Admit: 2014-09-24 | Discharge: 2014-09-25 | Disposition: A | Discharge status: Home / Self Care | Payer: BLUE CROSS/BLUE SHIELD | Source: Ambulatory Visit | Attending: Orthopedic Surgery | Admitting: Orthopedic Surgery

## 2014-09-24 DIAGNOSIS — Z79899 Other long term (current) drug therapy: Secondary | ICD-10-CM | POA: Insufficient documentation

## 2014-09-24 DIAGNOSIS — Z79891 Long term (current) use of opiate analgesic: Secondary | ICD-10-CM | POA: Insufficient documentation

## 2014-09-24 DIAGNOSIS — I10 Essential (primary) hypertension: Secondary | ICD-10-CM | POA: Insufficient documentation

## 2014-09-24 DIAGNOSIS — M21372 Foot drop, left foot: Secondary | ICD-10-CM | POA: Diagnosis not present

## 2014-09-24 DIAGNOSIS — F17210 Nicotine dependence, cigarettes, uncomplicated: Secondary | ICD-10-CM | POA: Diagnosis not present

## 2014-09-24 DIAGNOSIS — Z96641 Presence of right artificial hip joint: Secondary | ICD-10-CM | POA: Diagnosis not present

## 2014-09-24 DIAGNOSIS — M48062 Spinal stenosis, lumbar region with neurogenic claudication: Secondary | ICD-10-CM | POA: Diagnosis present

## 2014-09-24 DIAGNOSIS — R109 Unspecified abdominal pain: Secondary | ICD-10-CM

## 2014-09-24 DIAGNOSIS — M4806 Spinal stenosis, lumbar region: Principal | ICD-10-CM | POA: Insufficient documentation

## 2014-09-24 DIAGNOSIS — R52 Pain, unspecified: Secondary | ICD-10-CM

## 2014-09-24 DIAGNOSIS — M1990 Unspecified osteoarthritis, unspecified site: Secondary | ICD-10-CM | POA: Diagnosis not present

## 2014-09-24 HISTORY — PX: LUMBAR LAMINECTOMY/DECOMPRESSION MICRODISCECTOMY: SHX5026

## 2014-09-24 SURGERY — LUMBAR LAMINECTOMY/DECOMPRESSION MICRODISCECTOMY 1 LEVEL
Anesthesia: General | Site: Back | Laterality: Left

## 2014-09-24 MED ORDER — HYDROMORPHONE HCL 1 MG/ML IJ SOLN
INTRAMUSCULAR | Status: AC
  Filled 2014-09-24: qty 1

## 2014-09-24 MED ORDER — SODIUM CHLORIDE 0.9 % IR SOLN
Status: DC | PRN
  Administered 2014-09-24: 500 mL

## 2014-09-24 MED ORDER — THROMBIN 5000 UNITS EX SOLR
OROMUCOSAL | Status: DC | PRN
  Administered 2014-09-24: 10 mL via TOPICAL

## 2014-09-24 MED ORDER — BACITRACIN-NEOMYCIN-POLYMYXIN 400-5-5000 EX OINT
TOPICAL_OINTMENT | CUTANEOUS | Status: DC | PRN
  Administered 2014-09-24: 1 via TOPICAL

## 2014-09-24 MED ORDER — ACETAMINOPHEN 650 MG RE SUPP: 650.0000 mg | RECTAL | Status: DC | PRN

## 2014-09-24 MED ORDER — GLYCOPYRROLATE 0.2 MG/ML IJ SOLN
INTRAMUSCULAR | Status: AC
  Filled 2014-09-24: qty 3

## 2014-09-24 MED ORDER — METHOCARBAMOL 1000 MG/10ML IJ SOLN
500.0000 mg | Freq: Four times a day (QID) | INTRAVENOUS | Status: DC | PRN
  Administered 2014-09-24: 500 mg via INTRAVENOUS
  Filled 2014-09-24 (×2): qty 5

## 2014-09-24 MED ORDER — NEOSTIGMINE METHYLSULFATE 10 MG/10ML IV SOLN
INTRAVENOUS | Status: AC
  Filled 2014-09-24: qty 1

## 2014-09-24 MED ORDER — PHENYLEPHRINE HCL 10 MG/ML IJ SOLN
INTRAMUSCULAR | Status: DC | PRN
  Administered 2014-09-24 (×4): 40 ug via INTRAVENOUS

## 2014-09-24 MED ORDER — PROPOFOL 10 MG/ML IV BOLUS
INTRAVENOUS | Status: DC | PRN
  Administered 2014-09-24: 110 mg via INTRAVENOUS

## 2014-09-24 MED ORDER — ROCURONIUM BROMIDE 100 MG/10ML IV SOLN
INTRAVENOUS | Status: DC | PRN
  Administered 2014-09-24 (×2): 10 mg via INTRAVENOUS
  Administered 2014-09-24: 30 mg via INTRAVENOUS

## 2014-09-24 MED ORDER — LACTATED RINGERS IV SOLN: INTRAVENOUS | Status: DC

## 2014-09-24 MED ORDER — GLYCOPYRROLATE 0.2 MG/ML IJ SOLN
INTRAMUSCULAR | Status: DC | PRN
  Administered 2014-09-24: .6 mg via INTRAVENOUS

## 2014-09-24 MED ORDER — POLYETHYLENE GLYCOL 3350 17 G PO PACK: 17.0000 g | PACK | Freq: Every day | ORAL | Status: DC | PRN

## 2014-09-24 MED ORDER — BACITRACIN-NEOMYCIN-POLYMYXIN 400-5-5000 EX OINT
TOPICAL_OINTMENT | CUTANEOUS | Status: AC
  Filled 2014-09-24: qty 1

## 2014-09-24 MED ORDER — PHENYLEPHRINE 40 MCG/ML (10ML) SYRINGE FOR IV PUSH (FOR BLOOD PRESSURE SUPPORT)
PREFILLED_SYRINGE | INTRAVENOUS | Status: AC
  Filled 2014-09-24: qty 10

## 2014-09-24 MED ORDER — LACTATED RINGERS IV SOLN
INTRAVENOUS | Status: DC
  Administered 2014-09-24: 07:00:00 via INTRAVENOUS

## 2014-09-24 MED ORDER — SODIUM CHLORIDE 0.9 % IR SOLN
Status: AC
  Filled 2014-09-24: qty 1

## 2014-09-24 MED ORDER — BISACODYL 5 MG PO TBEC: 5.0000 mg | DELAYED_RELEASE_TABLET | Freq: Every day | ORAL | Status: DC | PRN

## 2014-09-24 MED ORDER — ONDANSETRON HCL 4 MG/2ML IJ SOLN: 4.0000 mg | INTRAMUSCULAR | Status: DC | PRN

## 2014-09-24 MED ORDER — AMLODIPINE BESYLATE 5 MG PO TABS
5.0000 mg | ORAL_TABLET | Freq: Every day | ORAL | Status: DC
  Administered 2014-09-24: 5 mg via ORAL
  Filled 2014-09-24 (×2): qty 1

## 2014-09-24 MED ORDER — PROMETHAZINE HCL 25 MG/ML IJ SOLN: 6.2500 mg | INTRAMUSCULAR | Status: DC | PRN

## 2014-09-24 MED ORDER — LIP MEDEX EX OINT
TOPICAL_OINTMENT | CUTANEOUS | Status: AC
  Administered 2014-09-24: 1
  Filled 2014-09-24: qty 7

## 2014-09-24 MED ORDER — FENTANYL CITRATE (PF) 250 MCG/5ML IJ SOLN
INTRAMUSCULAR | Status: AC
  Filled 2014-09-24: qty 5

## 2014-09-24 MED ORDER — HYDROMORPHONE HCL 1 MG/ML IJ SOLN
0.2500 mg | INTRAMUSCULAR | Status: DC | PRN
  Administered 2014-09-24 (×2): 0.25 mg via INTRAVENOUS

## 2014-09-24 MED ORDER — CEFAZOLIN SODIUM 1-5 GM-% IV SOLN
1.0000 g | Freq: Three times a day (TID) | INTRAVENOUS | Status: AC
  Administered 2014-09-24 – 2014-09-25 (×3): 1 g via INTRAVENOUS
  Filled 2014-09-24 (×3): qty 50

## 2014-09-24 MED ORDER — PROPOFOL 10 MG/ML IV BOLUS
INTRAVENOUS | Status: AC
  Filled 2014-09-24: qty 20

## 2014-09-24 MED ORDER — PNEUMOCOCCAL VAC POLYVALENT 25 MCG/0.5ML IJ INJ
0.5000 mL | INJECTION | INTRAMUSCULAR | Status: DC
  Filled 2014-09-24 (×2): qty 0.5

## 2014-09-24 MED ORDER — THROMBIN 5000 UNITS EX SOLR
CUTANEOUS | Status: AC
  Filled 2014-09-24: qty 10000

## 2014-09-24 MED ORDER — MIDAZOLAM HCL 5 MG/5ML IJ SOLN
INTRAMUSCULAR | Status: DC | PRN
  Administered 2014-09-24: 2 mg via INTRAVENOUS

## 2014-09-24 MED ORDER — ROCURONIUM BROMIDE 100 MG/10ML IV SOLN
INTRAVENOUS | Status: AC
  Filled 2014-09-24: qty 1

## 2014-09-24 MED ORDER — HYDROMORPHONE HCL 1 MG/ML IJ SOLN: 0.5000 mg | INTRAMUSCULAR | Status: DC | PRN

## 2014-09-24 MED ORDER — METHOCARBAMOL 500 MG PO TABS
500.0000 mg | ORAL_TABLET | Freq: Four times a day (QID) | ORAL | Status: DC | PRN
  Administered 2014-09-24 – 2014-09-25 (×3): 500 mg via ORAL
  Filled 2014-09-24 (×3): qty 1

## 2014-09-24 MED ORDER — BUPIVACAINE LIPOSOME 1.3 % IJ SUSP
20.0000 mL | Freq: Once | INTRAMUSCULAR | Status: AC
  Administered 2014-09-24: 20 mL
  Filled 2014-09-24: qty 20

## 2014-09-24 MED ORDER — OXYCODONE-ACETAMINOPHEN 5-325 MG PO TABS
1.0000 | ORAL_TABLET | ORAL | Status: DC | PRN
  Administered 2014-09-24 – 2014-09-25 (×3): 2 via ORAL
  Filled 2014-09-24 (×3): qty 2

## 2014-09-24 MED ORDER — MEPERIDINE HCL 50 MG/ML IJ SOLN: 6.2500 mg | INTRAMUSCULAR | Status: DC | PRN

## 2014-09-24 MED ORDER — LACTATED RINGERS IV SOLN
INTRAVENOUS | Status: DC
  Administered 2014-09-24: 17:00:00 via INTRAVENOUS

## 2014-09-24 MED ORDER — OXYCODONE-ACETAMINOPHEN 5-325 MG PO TABS: 1.0000 | ORAL_TABLET | ORAL | Status: DC | PRN

## 2014-09-24 MED ORDER — ONDANSETRON HCL 4 MG/2ML IJ SOLN
INTRAMUSCULAR | Status: AC
  Filled 2014-09-24: qty 2

## 2014-09-24 MED ORDER — LIDOCAINE HCL (CARDIAC) 20 MG/ML IV SOLN
INTRAVENOUS | Status: DC | PRN
  Administered 2014-09-24: 50 mg via INTRAVENOUS

## 2014-09-24 MED ORDER — CEFAZOLIN SODIUM-DEXTROSE 2-3 GM-% IV SOLR
INTRAVENOUS | Status: AC
  Filled 2014-09-24: qty 50

## 2014-09-24 MED ORDER — PHENOL 1.4 % MT LIQD
1.0000 | OROMUCOSAL | Status: DC | PRN
  Filled 2014-09-24: qty 177

## 2014-09-24 MED ORDER — BUPIVACAINE-EPINEPHRINE 0.5% -1:200000 IJ SOLN
INTRAMUSCULAR | Status: DC | PRN
  Administered 2014-09-24: 18 mL

## 2014-09-24 MED ORDER — ACETAMINOPHEN 325 MG PO TABS: 650.0000 mg | ORAL_TABLET | ORAL | Status: DC | PRN

## 2014-09-24 MED ORDER — MENTHOL 3 MG MT LOZG: 1.0000 | LOZENGE | OROMUCOSAL | Status: DC | PRN

## 2014-09-24 MED ORDER — MIDAZOLAM HCL 2 MG/2ML IJ SOLN
INTRAMUSCULAR | Status: AC
  Filled 2014-09-24: qty 2

## 2014-09-24 MED ORDER — NEOSTIGMINE METHYLSULFATE 10 MG/10ML IV SOLN
INTRAVENOUS | Status: DC | PRN
  Administered 2014-09-24: 3.5 mg via INTRAVENOUS

## 2014-09-24 MED ORDER — HYDROCODONE-ACETAMINOPHEN 5-325 MG PO TABS
1.0000 | ORAL_TABLET | ORAL | Status: DC | PRN
  Administered 2014-09-24 (×2): 2 via ORAL
  Administered 2014-09-24: 1 via ORAL
  Filled 2014-09-24: qty 2
  Filled 2014-09-24: qty 1
  Filled 2014-09-24: qty 2

## 2014-09-24 MED ORDER — CEFAZOLIN SODIUM-DEXTROSE 2-3 GM-% IV SOLR
2.0000 g | INTRAVENOUS | Status: AC
  Administered 2014-09-24: 2 g via INTRAVENOUS

## 2014-09-24 MED ORDER — BUPIVACAINE-EPINEPHRINE (PF) 0.5% -1:200000 IJ SOLN
INTRAMUSCULAR | Status: AC
  Filled 2014-09-24: qty 30

## 2014-09-24 MED ORDER — FLEET ENEMA 7-19 GM/118ML RE ENEM: 1.0000 | ENEMA | Freq: Once | RECTAL | Status: AC | PRN

## 2014-09-24 MED ORDER — FENTANYL CITRATE (PF) 100 MCG/2ML IJ SOLN
INTRAMUSCULAR | Status: DC | PRN
  Administered 2014-09-24 (×2): 50 ug via INTRAVENOUS
  Administered 2014-09-24: 100 ug via INTRAVENOUS
  Administered 2014-09-24: 50 ug via INTRAVENOUS

## 2014-09-24 SURGICAL SUPPLY — 38 items
BAG SPEC THK2 15X12 ZIP CLS (MISCELLANEOUS) ×1
BAG ZIPLOCK 12X15 (MISCELLANEOUS) ×3 IMPLANT
CLEANER TIP ELECTROSURG 2X2 (MISCELLANEOUS) ×3 IMPLANT
DRAPE MICROSCOPE LEICA (MISCELLANEOUS) ×3 IMPLANT
DRAPE POUCH INSTRU U-SHP 10X18 (DRAPES) ×3 IMPLANT
DRAPE SURG 17X11 SM STRL (DRAPES) ×3 IMPLANT
DRSG ADAPTIC 3X8 NADH LF (GAUZE/BANDAGES/DRESSINGS) ×3 IMPLANT
DRSG PAD ABDOMINAL 8X10 ST (GAUZE/BANDAGES/DRESSINGS) ×4 IMPLANT
DURAPREP 26ML APPLICATOR (WOUND CARE) ×3 IMPLANT
ELECT BLADE TIP CTD 4 INCH (ELECTRODE) ×3 IMPLANT
ELECT REM PT RETURN 9FT ADLT (ELECTROSURGICAL) ×3
ELECTRODE REM PT RTRN 9FT ADLT (ELECTROSURGICAL) ×1 IMPLANT
GAUZE SPONGE 4X4 12PLY STRL (GAUZE/BANDAGES/DRESSINGS) ×3 IMPLANT
GLOVE BIOGEL PI IND STRL 8 (GLOVE) ×1 IMPLANT
GLOVE BIOGEL PI INDICATOR 8 (GLOVE) ×2
GLOVE ECLIPSE 8.0 STRL XLNG CF (GLOVE) ×3 IMPLANT
GOWN STRL REUS W/TWL XL LVL3 (GOWN DISPOSABLE) ×6 IMPLANT
KIT BASIN OR (CUSTOM PROCEDURE TRAY) ×3 IMPLANT
KIT POSITIONING SURG ANDREWS (MISCELLANEOUS) ×3 IMPLANT
MANIFOLD NEPTUNE II (INSTRUMENTS) ×3 IMPLANT
NDL SPNL 18GX3.5 QUINCKE PK (NEEDLE) ×2 IMPLANT
NEEDLE SPNL 18GX3.5 QUINCKE PK (NEEDLE) ×6 IMPLANT
PACK LAMINECTOMY ORTHO (CUSTOM PROCEDURE TRAY) ×3 IMPLANT
PAD ABD 8X10 STRL (GAUZE/BANDAGES/DRESSINGS) ×2 IMPLANT
PATTIES SURGICAL .5 X.5 (GAUZE/BANDAGES/DRESSINGS) ×3 IMPLANT
PATTIES SURGICAL .75X.75 (GAUZE/BANDAGES/DRESSINGS) IMPLANT
PATTIES SURGICAL 1X1 (DISPOSABLE) IMPLANT
RUBBERBAND STERILE (MISCELLANEOUS) ×2 IMPLANT
SPONGE LAP 4X18 X RAY DECT (DISPOSABLE) ×8 IMPLANT
SPONGE SURGIFOAM ABS GEL 100 (HEMOSTASIS) ×3 IMPLANT
STAPLER VISISTAT 35W (STAPLE) ×3 IMPLANT
SUT VIC AB 0 CT1 27 (SUTURE) ×3
SUT VIC AB 0 CT1 27XBRD ANTBC (SUTURE) ×1 IMPLANT
SUT VIC AB 1 CT1 27 (SUTURE) ×9
SUT VIC AB 1 CT1 27XBRD ANTBC (SUTURE) ×3 IMPLANT
SYR 20CC LL (SYRINGE) ×3 IMPLANT
TAPE CLOTH SURG 6X10 WHT LF (GAUZE/BANDAGES/DRESSINGS) ×2 IMPLANT
TOWEL OR 17X26 10 PK STRL BLUE (TOWEL DISPOSABLE) ×3 IMPLANT

## 2014-09-24 NOTE — Anesthesia Postprocedure Evaluation (Signed)
  Anesthesia Post-op Note  Patient: Derek Rollins  Procedure(s) Performed: Procedure(s) (LRB): CENTRAL DECOMPRESSION  LUMBAR LAMENECTOMYL 4-L5, FORAMENOTOMY L 4-5 ROOT FOR FORAMIAL STENOSIS, EXCISION OF SCAR TISSUE L 4-5 (Left)  Patient Location: PACU  Anesthesia Type: General  Level of Consciousness: awake and alert   Airway and Oxygen Therapy: Patient Spontanous Breathing  Post-op Pain: mild  Post-op Assessment: Post-op Vital signs reviewed, Patient's Cardiovascular Status Stable, Respiratory Function Stable, Patent Airway and No signs of Nausea or vomiting  Last Vitals:  Filed Vitals:   09/24/14 1048  BP: 134/82  Pulse: 84  Temp: 36.6 C  Resp: 15    Post-op Vital Signs: stable   Complications: No apparent anesthesia complications

## 2014-09-24 NOTE — Op Note (Signed)
NAME:  Derek Rollins, Derek Rollins                    ACCOUNT NO.:  [id number]  MEDICAL RECORD NO.:  [id number]  LOCATION:  1614                         FACILITY:  [facility]  PHYSICIAN:  Georges Lynch. Lashante Fryberger, M.D.DATE OF BIRTH:  1955-07-08  DATE OF PROCEDURE:  09/24/2014 DATE OF DISCHARGE:                              OPERATIVE REPORT   SURGEON:  Georges Lynch. Darrelyn Hillock, M.D.  ASSISTANT:  Marlowe Kays, M.D.  PREOPERATIVE DIAGNOSES: 1. He had a previous lumbar laminectomy at L4-5 on the left about 23     years ago by another Careers adviser. 2. He has spinal stenosis at L4-5, recurrent. 3. Rule out herniated lumbar disk at L4-5 on the left. 4. Foraminal stenosis of the L4 root on the left. 5. Foraminal stenosis at L5 root on the left. The patient also had a previous left total hip done by me in the past.  POSTOPERATIVE DIAGNOSES: 1. Excessive scar resulting in spinal stenosis at L4-5 mainly on the     left. 2. Foraminal stenosis of the L4 root on the left. 3. Foraminal stenosis for the L5 root on the left. 4. He had a partial footdrop on the left.  He did have left leg pain     only.  No right leg pain.  PROCEDURE:  Under general anesthesia with the patient laid supine on rolls, a routine orthopedic prep and draping of the lower back was carried out.  Note, at this time, 2 needles were placed in the back after the time-out was carried out and I did mark the left side of his back in the holding area even though we went central.  Two needles were placed in the back for localization purposes.  X-ray was taken.  At this time, incision was made over the L4-5 interspace.  The incision was extended distally and proximally.  Another x-ray was taken after I stripped the muscle from the lamina and spinous process bilaterally.  At this time, I then removed the spinous process of L4, a small portion of L5 distally.  We went down centrally down to the lamina.  We then brought the microscope in and removed the scar tissue  from the lamina and then began the central decompression.  We did a nice central decompression, went out far laterally on both sides.  We went superiorly and distally until we had complete opening of the canal.  Note, there was an excessive amount of scar tissue.  We took a great deal of time gently dissecting free from the lateral recess on the left.  We did a nice foraminotomy for the 4 root.  The 5 root, we were able to easily pass a hockey-stick out in those areas.  We then went down into the lateral recess, gently removed that scar tissue, I was able to identify the roots.  I was able to probe up under the roots as well as out distally and proximally.  This supposedly migrated proximally as well, and there really was not a true herniated disk.  What we found was mainly scar tissue that we removed.  We freed up both roots nicely.  I thoroughly irrigated out the area.  There  was no stenosis on the opposite side.  We then loosely applied some thrombin-soaked Gelfoam and then closed the wound in layers in usual fashion.  I injected 20 mL of Exparel at the end of the procedure into the muscle and soft tissue.  At the beginning of procedure, I injected 20 mL of 0.25% Marcaine with epinephrine into the muscle for bleeding purpose to control the bleeding.  The patient did have 1 g of IV Ancef preop.  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  He had some calcifications, so I am going to get an ultrasound of his abdomen while he is in the hospital today.  We tried to get that done preop, but we could not reach the patient.          ______________________________ Georges Lynchonald A. Darrelyn HillockGioffre, M.D.     RAG/MEDQ  D:  09/24/2014  T:  09/24/2014  Job:  [id number]

## 2014-09-24 NOTE — Interval H&P Note (Signed)
History and Physical Interval Note:  09/24/2014 7:00 AM  Derek Rollins  has presented today for surgery, with the diagnosis of HERNIATED DISC  The various methods of treatment have been discussed with the patient and family. After consideration of risks, benefits and other options for treatment, the patient has consented to  Procedure(s): CENTRAL DECOMPRESSION L4-L5 MICRODISCECTOMY LEFT (1 LEVEL) (Left) as a surgical intervention .  The patient's history has been reviewed, patient examined, no change in status, stable for surgery.  I have reviewed the patient's chart and labs.  Questions were answered to the patient's satisfaction.     [name] A

## 2014-09-24 NOTE — Brief Op Note (Signed)
09/24/2014  9:30 AM  PATIENT:  Derek Rollins  59 y.o. male  PRE-OPERATIVE DIAGNOSIS:  HERNIATED DISC and Spinal Stenosis,Recurrent  POST-OPERATIVE DIAGNOSIS: Severe Spinal Stenosis at L-4-L-5 and Foraminal Stenosis involving the L-4 and L-5 nerve roots.  PROCEDURE:  Complete Decompressive  Lumbar Laminectomy at L-4-L-5 for Recurrent Spinal Stenosis and Foraminotomies for L-4 and L-5 roots for Foraminal Stenosis and Excision of Excessive Scar material at same Level.  SURGEON:  Surgeon(s) and Role:    * Ranee Gosselinonald Tashon Capp, MD - Primary    * Drucilla SchmidtJames P Aplington, MD - Assisting    ASSISTANT: Marlowe KaysJames Aplington MD  ANESTHESIA:   general  EBL:     BLOOD ADMINISTERED:none  DRAINS: none   LOCAL MEDICATIONS USED:  MARCAINE 20cc of 0.25% with Epinephrine at start of the case and 20cc of Exparel at the end of the case.    SPECIMEN:  No Specimen  DISPOSITION OF SPECIMEN:  N/A  COUNTS:  YES  TOURNIQUET:  * No tourniquets in log *  DICTATION: .Other Dictation: Dictation Number [id number]  PLAN OF CARE: Admit for overnight observation  PATIENT DISPOSITION:  PACU - hemodynamically stable.   Delay start of Pharmacological VTE agent (>24hrs) due to surgical blood loss or risk of bleeding: yes

## 2014-09-24 NOTE — Anesthesia Preprocedure Evaluation (Addendum)
Anesthesia Evaluation  Patient identified by MRN, date of birth, ID band Patient awake    Reviewed: Allergy & Precautions, NPO status , Patient's Chart, lab work & pertinent test results  Airway Mallampati: II  TM Distance: >3 FB Neck ROM: Full    Dental no notable dental hx. (+) Poor Dentition   Pulmonary Current Smoker,  breath sounds clear to auscultation  Pulmonary exam normal       Cardiovascular hypertension, Pt. on medications Rhythm:Regular Rate:Normal     Neuro/Psych negative neurological ROS  negative psych ROS   GI/Hepatic negative GI ROS, Neg liver ROS,   Endo/Other  negative endocrine ROS  Renal/GU negative Renal ROS  negative genitourinary   Musculoskeletal negative musculoskeletal ROS (+)   Abdominal   Peds negative pediatric ROS (+)  Hematology negative hematology ROS (+)   Anesthesia Other Findings   Reproductive/Obstetrics negative OB ROS                            Anesthesia Physical Anesthesia Plan  ASA: II  Anesthesia Plan: General   Post-op Pain Management:    Induction: Intravenous  Airway Management Planned: Oral ETT  Additional Equipment:   Intra-op Plan:   Post-operative Plan: Extubation in OR  Informed Consent: I have reviewed the patients History and Physical, chart, labs and discussed the procedure including the risks, benefits and alternatives for the proposed anesthesia with the patient or authorized representative who has indicated his/her understanding and acceptance.   Dental advisory given  Plan Discussed with: CRNA  Anesthesia Plan Comments:         Anesthesia Quick Evaluation

## 2014-09-24 NOTE — Transfer of Care (Signed)
Immediate Anesthesia Transfer of Care Note  Patient: Derek Rollins  Procedure(s) Performed: Procedure(s): CENTRAL DECOMPRESSION  LUMBAR LAMENECTOMYL 4-L5, FORAMENOTOMY L 4-5 ROOT FOR FORAMIAL STENOSIS, EXCISION OF SCAR TISSUE L 4-5 (Left)  Patient Location: PACU  Anesthesia Type:General  Level of Consciousness:  sedated, patient cooperative and responds to stimulation  Airway & Oxygen Therapy:Patient Spontanous Breathing and Patient connected to face mask oxgen  Post-op Assessment:  Report given to PACU RN and Post -op Vital signs reviewed and stable  Post vital signs:  Reviewed and stable  Last Vitals:  Filed Vitals:   09/24/14 0523  BP: 158/91  Pulse: 80  Temp: 36.6 C  Resp: 16    Complications: No apparent anesthesia complications

## 2014-09-24 NOTE — Progress Notes (Signed)
Patient's primary language is [place]. Used an interpreter to verify patient, provide information on tubes and drains, pain management, fall and back precautions, use of incentive spirometer, and to complete admission history. Patient given an opportunity to ask questions, and he has none at this time.

## 2014-09-24 NOTE — Anesthesia Procedure Notes (Signed)
Procedure Name: Intubation Date/Time: 09/24/2014 7:25 AM Performed by: Paris LoreBLANTON, Sharilyn Geisinger M Pre-anesthesia Checklist: Patient identified, Emergency Drugs available, Suction available, Patient being monitored and Timeout performed Patient Re-evaluated:Patient Re-evaluated prior to inductionOxygen Delivery Method: Circle system utilized Preoxygenation: Pre-oxygenation with 100% oxygen Intubation Type: IV induction and Cricoid Pressure applied Ventilation: Mask ventilation without difficulty Laryngoscope Size: Mac and 4 Grade View: Grade III Tube type: Oral Tube size: 7.5 mm Number of attempts: 1 Airway Equipment and Method: Bougie stylet Placement Confirmation: ETT inserted through vocal cords under direct vision,  positive ETCO2,  CO2 detector and breath sounds checked- equal and bilateral Secured at: 19 cm Tube secured with: Tape Dental Injury: Teeth and Oropharynx as per pre-operative assessment  Difficulty Due To: Difficult Airway- due to anterior larynx

## 2014-09-24 NOTE — Discharge Instructions (Signed)
Change your dressing daily. For the first three days, cover wound with plastic wrap while showering. Reapply new dressing after shower Shower only, no tub bath. Call if any temperatures greater than 101 or any wound complications: [phone] during the day and ask for Dr. Jeannetta EllisGioffre's nurse, Mackey Birchwoodammy Johnson.

## 2014-09-25 DIAGNOSIS — M4806 Spinal stenosis, lumbar region: Secondary | ICD-10-CM | POA: Diagnosis not present

## 2014-09-25 NOTE — Progress Notes (Signed)
   09/25/14 1014  PT Time Calculation  PT Start Time (ACUTE ONLY) 0945  PT Stop Time (ACUTE ONLY) 1003  PT Time Calculation (min) (ACUTE ONLY) 18 min  PT G-Codes **NOT FOR INPATIENT CLASS**  Functional Assessment Tool Used (clinicl judgement)  Functional Limitation Mobility: Walking and moving around  Mobility: Walking and Moving Around Current Status (R6045(G8978) CI  Mobility: Walking and Moving Around Goal Status (W0981(G8979) CI  Mobility: Walking and Moving Around Discharge Status (X9147(G8980) CI  PT General Charges  $$ ACUTE PT VISIT 1 Procedure  PT Evaluation  $Initial PT Evaluation Tier I 1 Procedure   Rebeca AlertJannie Kallie Depolo, MPT [phone]

## 2014-09-25 NOTE — Progress Notes (Signed)
UR completed 

## 2014-09-25 NOTE — Evaluation (Signed)
Physical Therapy Evaluation Patient Details Name: Derek Rollins MRN: [id number] DOB: 1956/02/20 Today's Date: 09/25/2014   History of Present Illness  59 yo male s/p laminectomy L4-L5 09/24/14. 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 [place] is [place] but speaks some [place].   Clinical Impression  On eval, pt was supervision level assist for mobility-able to ambulate ~200 feet with use of RW, climbed 4 steps with use of rail. Pt tolerated activity well and denied pain throughout session. Verbally instructed and demonstrated logroll technique and back precautions. Reviewed back precautions multiple times during session-pt requires cues to adhere to them when mobilizing. Used pillow between knees when rolling for adherence to hip precautions (recent surgery 06/2014). Pt reports his daughter will be assisting him after d/c.Pt reports he has had back surgery before and he is familiar with process. Issued back handout as a reminder as well. All education completed. Pt attempting to get dressed unassisted so asked nurse tech to assist him with this.     Follow Up Recommendations No PT follow up;Supervision for mobility/OOB    Equipment Recommendations  None recommended by PT    Recommendations for Other Services OT consult     Precautions / Restrictions Precautions Precautions: Back Precaution Booklet Issued: Yes (comment) Precaution Comments: Reviewed and demonstrated back precautions and logroll technique.  Restrictions Weight Bearing Restrictions: No      Mobility  Bed Mobility Overal bed mobility: Needs Assistance Bed Mobility: Rolling;Sidelying to Sit Rolling: Supervision Sidelying to sit: Supervision       General bed mobility comments: VCs safety, adherence to precautions  Transfers Overall transfer level: Needs assistance   Transfers: Sit to/from Stand Sit to Stand: Supervision         General transfer comment: VCs safety, hand placement  Ambulation/Gait Ambulation/Gait  assistance: Supervision Ambulation Distance (Feet): 200 Feet Assistive device: Rolling walker (2 wheeled) Gait Pattern/deviations: Step-through pattern     General Gait Details: VCs safety. Pt tolerated well. Denied increase in pain.  Stairs Stairs: Yes   Stair Management: One rail Right;Step to pattern;Forwards Number of Stairs: 4 General stair comments: VCs for safety technique (one step at a time).   Wheelchair Mobility    Modified Rankin (Stroke Patients Only)       Balance                                             Pertinent Vitals/Pain Pain Assessment: No/denies pain    Home Living                        Prior Function                 Hand Dominance        Extremity/Trunk Assessment   Upper Extremity Assessment: Defer to OT evaluation           Lower Extremity Assessment: Overall WFL for tasks assessed      Cervical / Trunk Assessment: Normal  Communication      Cognition Arousal/Alertness: Awake/alert Behavior During Therapy: WFL for tasks assessed/performed Overall Cognitive Status: Within Functional Limits for tasks assessed                      General Comments      Exercises        Assessment/Plan    PT Assessment Patent  does not need any further PT services  PT Diagnosis     PT Problem List    PT Treatment Interventions     PT Goals (Current goals can be found in the Care Plan section) Acute Rehab PT Goals Patient Stated Goal: none stated PT Goal Formulation: All assessment and education complete, DC therapy    Frequency     Barriers to discharge        Co-evaluation               End of Session   Activity Tolerance: Patient tolerated treatment well Patient left: in chair;with call bell/phone within reach           Time: 0945-1003 PT Time Calculation (min) (ACUTE ONLY): 18 min   Charges:   PT Evaluation $Initial PT Evaluation Tier I: 1 Procedure     PT  G Codes:        [name], MPT Pager: [phone]

## 2014-09-25 NOTE — Care Management Note (Signed)
    Page 1 of 1   09/25/2014     11:23:11 AM CARE MANAGEMENT NOTE 09/25/2014  Patient:  Elesa MassedVO,Derek T   Account Number:  [id number]  Date Initiated:  09/25/2014  Documentation initiated by:  Lanier ClamMAHABIR,Kiyaan Haq  Subjective/Objective Assessment:   59 y/o m admitted w/spinal stenosis.     Action/Plan:   from home.   Anticipated DC Date:  09/25/2014   Anticipated DC Plan:  HOME/SELF CARE      DC Planning Services  CM consult      Choice offered to / List presented to:             Status of service:  Completed, signed off Medicare Important Message given?   (If response is "NO", the following Medicare IM given date fields will be blank) Date Medicare IM given:   Medicare IM given by:   Date Additional Medicare IM given:   Additional Medicare IM given by:    Discharge Disposition:  HOME/SELF CARE  Per UR Regulation:  Reviewed for med. necessity/level of care/duration of stay  If discussed at Long Length of Stay Meetings, dates discussed:    Comments:  09/25/14 Lanier ClamKathy Tarl Cephas RN BSn NCM [phone] PT- no f/u.No d/c needs.

## 2014-09-25 NOTE — Progress Notes (Signed)
   Subjective: 1 Day Post-Op Procedure(s) (LRB): CENTRAL DECOMPRESSION  LUMBAR LAMENECTOMYL 4-L5, FORAMENOTOMY L 4-5 ROOT FOR FORAMIAL STENOSIS, EXCISION OF SCAR TISSUE L 4-5 (Left) Patient reports pain as mild.   Patient seen in rounds for Dr. Darrelyn HillockGioffre. Patient is well, and has had no acute complaints or problems. No SOB or chest pain. No issues overnight. Voiding well.    Objective: Vital signs in last 24 hours: Temp:  [97 F (36.1 C)-99.6 F (37.6 C)] 97 F (36.1 C) (04/16 0450) Pulse Rate:  [77-100] 100 (04/16 0450) Resp:  [15-21] 16 (04/16 0450) BP: (119-154)/(76-95) 136/82 mmHg (04/16 0450) SpO2:  [90 %-100 %] 99 % (04/16 0450)  Intake/Output from previous day:  Intake/Output Summary (Last 24 hours) at 09/25/14 0857 Last data filed at 09/25/14 0700  Gross per 24 hour  Intake 3763.33 ml  Output   4625 ml  Net -861.67 ml     EXAM General - Patient is Alert and Oriented Extremity - Neurologically intact Intact pulses distally Dorsiflexion/Plantar flexion intact No cellulitis present Compartment soft Dressing/Incision - clean, dry, no drainage Motor Function - intact, moving foot and toes well on exam.   Past Medical History  Diagnosis Date  . Hypertension   . Arthritis     Assessment/Plan: 1 Day Post-Op Procedure(s) (LRB): CENTRAL DECOMPRESSION  LUMBAR LAMENECTOMYL 4-L5, FORAMENOTOMY L 4-5 ROOT FOR FORAMIAL STENOSIS, EXCISION OF SCAR TISSUE L 4-5 (Left) Active Problems:   Spinal stenosis, lumbar region, with neurogenic claudication  Estimated body mass index is 19.3 kg/(m^2) as calculated from the following:   Height as of this encounter: 5\' 5"  (1.651 m).   Weight as of this encounter: 52.617 kg (116 lb). Advance diet Up with therapy D/C IV fluids Discharge home   DVT Prophylaxis - Aspirin Weight-Bearing as tolerated  Doing great. DC home today after therapy.   Dimitri PedAmber Sofie Schendel, PA-C Orthopaedic Surgery 09/25/2014, 8:57 AM

## 2014-09-25 NOTE — Progress Notes (Signed)
Occupational Therapy Evaluation Patient Details Name: Derek Rollins T Aguillard MRN: [id number] DOB: 01/10/1956 Today's Date: 09/25/2014    History of Present Illness 59 yo male s/p laminectomy L4-L5 09/24/14. 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 [place] is [place] but speaks some [place].    Clinical Impression   All OT education completed and patient has no further OT needs. OT will sign off.    Follow Up Recommendations  No OT follow up;Supervision - Intermittent    Equipment Recommendations  None recommended by OT (has all needed equipment)    Recommendations for Other Services       Precautions / Restrictions Precautions Precautions: Back  Precaution Comments: Reviewed and demonstrated back precautions and logroll technique.  Restrictions Weight Bearing Restrictions: No      Mobility     Balance                                            ADL Overall ADL's : Modified independent                                       General ADL Comments: occasional cues for adhering to back precautions     Vision     Perception     Praxis      Pertinent Vitals/Pain Pain Assessment: No/denies pain     Hand Dominance     Extremity/Trunk Assessment Upper Extremity Assessment Upper Extremity Assessment: Overall WFL for tasks assessed   Lower Extremity Assessment Lower Extremity Assessment: Defer to PT evaluation   Cervical / Trunk Assessment Cervical / Trunk Assessment: Normal   Communication Communication Communication: No difficulties;Prefers language other than English   Cognition Arousal/Alertness: Awake/alert Behavior During Therapy: WFL for tasks assessed/performed Overall Cognitive Status: Within Functional Limits for tasks assessed                     General Comments       Exercises       Shoulder Instructions      Home Living Family/patient expects to be discharged to:: Private residence Living Arrangements:  Spouse/significant other Available Help at Discharge: Family Type of Home: House Home Access: Stairs to enter Secretary/administratorntrance Stairs-Number of Steps: 3 Entrance Stairs-Rails: Can reach both Home Layout: One level;Able to live on main level with bedroom/bathroom     Bathroom Shower/Tub: Tub/shower unit;Curtain Shower/tub characteristics: Engineer, building servicesCurtain Bathroom Toilet: Standard Bathroom Accessibility: Yes   Home Equipment: Environmental consultantWalker - 2 wheels;Cane - single point;Shower seat;Adaptive equipment Adaptive Equipment: Reacher;Sock aid;Long-handled shoe horn;Long-handled sponge        Prior Functioning/Environment Level of Independence: Independent             OT Diagnosis: Other (comment) (back surgery)   OT Problem List:     OT Treatment/Interventions:      OT Goals(Current goals can be found in the care plan section) Acute Rehab OT Goals Patient Stated Goal: to go home  OT Frequency:     Barriers to D/C:            Co-evaluation              End of Session Equipment Utilized During Treatment: Other (comment) [facility] Nurse Communication: Other (comment) (no further OT needs)  Activity Tolerance: Patient tolerated treatment well Patient left: in chair;with  call bell/phone within reach;with family/visitor present   Time: 1112-1120 OT Time Calculation (min): 8 min Charges:  OT General Charges $OT Visit: 1 Procedure OT Evaluation $Initial OT Evaluation Tier I: 1 Procedure G-Codes: OT G-codes **NOT FOR INPATIENT CLASS** Functional Limitation: Self care Self Care Current Status (W0981): At least 1 percent but less than 20 percent impaired, limited or restricted Self Care Goal Status (X9147): At least 1 percent but less than 20 percent impaired, limited or restricted Self Care Discharge Status [phone]): At least 1 percent but less than 20 percent impaired, limited or restricted  Derek Rollins A 09/25/2014, 11:36 AM

## 2014-09-25 NOTE — Progress Notes (Signed)
Patient discharged to home. Reviewed discharge instructions, patient and family verbalized understanding.  Prescriptions given to patient. Patient will be escorted from unit via wheelchair with NT.  Dorothyann PengKim Kerrigan Glendening RN

## 2014-09-27 ENCOUNTER — Encounter (HOSPITAL_COMMUNITY): Payer: Self-pay | Admitting: Orthopedic Surgery

## 2014-09-27 NOTE — Discharge Summary (Signed)
Physician Discharge Summary   Patient ID: Derek Rollins MRN: [id number] DOB/AGE: 09/03/1955 59 y.o.  Admit date: 09/24/2014 Discharge date: 09/25/2014  Primary Diagnosis: Lumbar spinal stenosis  Lumbar disc herniation   Admission Diagnoses:  Past Medical History  Diagnosis Date  . Hypertension   . Arthritis    Discharge Diagnoses:   Active Problems:   Spinal stenosis, lumbar region, with neurogenic claudication  Estimated body mass index is 19.3 kg/(m^2) as calculated from the following:   Height as of this encounter: $RemoveBeforeD'5\' 5"'hNvdheMgfubMwL$  (1.651 m).   Weight as of this encounter: 52.617 kg (116 lb).  Procedure:  Procedure(s) (LRB): CENTRAL DECOMPRESSION  LUMBAR LAMENECTOMYL 4-L5, FORAMENOTOMY L 4-5 ROOT FOR FORAMIAL STENOSIS, EXCISION OF SCAR TISSUE L 4-5 (Left)   Consults: None  HPI: Derek Rollins presented with the chief complaint of low back pain and left leg pain. He started having trouble with this about two months ago. He had a right total hip arthroplasty 3 months ago and was doing well until he became limited by his left LE. He experienced pain and weakness in the left LE, developing a left foot drop. MRI showed lumbar spinal stenosis with disc herniation at L4-L5 on the left.   Laboratory Data: Hospital Outpatient Visit on 09/16/2014  Component Date Value Ref Range Status  . MRSA, PCR 09/16/2014 NEGATIVE  NEGATIVE Final  . Staphylococcus aureus 09/16/2014 NEGATIVE  NEGATIVE Final   Comment:        The Xpert SA Assay (FDA approved for NASAL specimens in patients over 27 years of age), is one component of a comprehensive surveillance program.  Test performance has been validated by [facility] for patients greater than or equal to 67 year old. It is not intended to diagnose infection nor to guide or monitor treatment.   . WBC 09/16/2014 7.6  4.0 - 10.5 K/uL Final  . RBC 09/16/2014 5.23  4.22 - 5.81 MIL/uL Final  . Hemoglobin 09/16/2014 15.1  13.0 - 17.0 g/dL Final  . HCT 09/16/2014  44.9  39.0 - 52.0 % Final  . MCV 09/16/2014 85.9  78.0 - 100.0 fL Final  . MCH 09/16/2014 28.9  26.0 - 34.0 pg Final  . MCHC 09/16/2014 33.6  30.0 - 36.0 g/dL Final  . RDW 09/16/2014 13.6  11.5 - 15.5 % Final  . Platelets 09/16/2014 327  150 - 400 K/uL Final  . Neutrophils Relative % 09/16/2014 52  43 - 77 % Final  . Neutro Abs 09/16/2014 4.0  1.7 - 7.7 K/uL Final  . Lymphocytes Relative 09/16/2014 33  12 - 46 % Final  . Lymphs Abs 09/16/2014 2.5  0.7 - 4.0 K/uL Final  . Monocytes Relative 09/16/2014 12  3 - 12 % Final  . Monocytes Absolute 09/16/2014 0.9  0.1 - 1.0 K/uL Final  . Eosinophils Relative 09/16/2014 2  0 - 5 % Final  . Eosinophils Absolute 09/16/2014 0.2  0.0 - 0.7 K/uL Final  . Basophils Relative 09/16/2014 1  0 - 1 % Final  . Basophils Absolute 09/16/2014 0.0  0.0 - 0.1 K/uL Final  . Sodium 09/16/2014 139  135 - 145 mmol/L Final  . Potassium 09/16/2014 4.2  3.5 - 5.1 mmol/L Final  . Chloride 09/16/2014 106  96 - 112 mmol/L Final  . CO2 09/16/2014 23  19 - 32 mmol/L Final  . Glucose, Bld 09/16/2014 100* 70 - 99 mg/dL Final  . BUN 09/16/2014 12  6 - 23 mg/dL Final  . Creatinine, Ser  09/16/2014 0.67  0.50 - 1.35 mg/dL Final  . Calcium 09/16/2014 9.1  8.4 - 10.5 mg/dL Final  . Total Protein 09/16/2014 7.2  6.0 - 8.3 g/dL Final  . Albumin 09/16/2014 4.1  3.5 - 5.2 g/dL Final  . AST 09/16/2014 26  0 - 37 U/L Final  . ALT 09/16/2014 29  0 - 53 U/L Final  . Alkaline Phosphatase 09/16/2014 75  39 - 117 U/L Final  . Total Bilirubin 09/16/2014 0.3  0.3 - 1.2 mg/dL Final  . GFR calc non Af Amer 09/16/2014 >90  >90 mL/min Final  . GFR calc Af Amer 09/16/2014 >90  >90 mL/min Final   Comment: (NOTE) The eGFR has been calculated using the CKD EPI equation. This calculation has not been validated in all clinical situations. eGFR's persistently <90 mL/min signify possible Chronic Kidney Disease.   . Anion gap 09/16/2014 10  5 - 15 Final  . Prothrombin Time 09/16/2014 12.3  11.6 -  15.2 seconds Final  . INR 09/16/2014 0.91  0.00 - 1.49 Final  . Color, Urine 09/16/2014 YELLOW  YELLOW Final  . APPearance 09/16/2014 CLEAR  CLEAR Final  . Specific Gravity, Urine 09/16/2014 1.004* 1.005 - 1.030 Final  . pH 09/16/2014 6.0  5.0 - 8.0 Final  . Glucose, UA 09/16/2014 NEGATIVE  NEGATIVE mg/dL Final  . Hgb urine dipstick 09/16/2014 NEGATIVE  NEGATIVE Final  . Bilirubin Urine 09/16/2014 NEGATIVE  NEGATIVE Final  . Ketones, ur 09/16/2014 NEGATIVE  NEGATIVE mg/dL Final  . Protein, ur 09/16/2014 NEGATIVE  NEGATIVE mg/dL Final  . Urobilinogen, UA 09/16/2014 0.2  0.0 - 1.0 mg/dL Final  . Nitrite 09/16/2014 NEGATIVE  NEGATIVE Final  . Leukocytes, UA 09/16/2014 NEGATIVE  NEGATIVE Final   MICROSCOPIC NOT DONE ON URINES WITH NEGATIVE PROTEIN, BLOOD, LEUKOCYTES, NITRITE, OR GLUCOSE <1000 mg/dL.     X-Rays:Dg Lumbar Spine 2-3 Views  09/16/2014   CLINICAL DATA:  Lumbar spine surgery.  Degenerative change .  EXAM: LUMBAR SPINE - 2-3 VIEW  COMPARISON:  08/12/2014.  FINDINGS: Lumbar vertebra numbered with the lowest segmented appearing vertebral lateral view is L5. Diffuse multilevel degenerative change present. Degenerative changes most prominent L3-L4, L4-5, L5-S1. Right hip replacement. Abdominal aortic ectasia and/or aneurysm cannot be excluded. Aortic ultrasound suggested for further evaluation.  IMPRESSION: 1. Diffuse degenerative changes lumbar spine. No acute bony abnormality.  2. Abdominal aortic ectasia and/or aneurysm cannot be excluded. Aortic ultrasound suggested for further evaluation .   Electronically Signed   By: Marcello Moores  Register   On: 09/16/2014 09:45   US Aorta  09/24/2014   CLINICAL DATA:  Possible aortic aneurysm on prior dissimilar imaging  EXAM: ULTRASOUND OF ABDOMINAL AORTA  TECHNIQUE: Ultrasound examination of the abdominal aorta was performed to evaluate for abdominal aortic aneurysm.  COMPARISON:  Lumbar spine radiographs 09/16/2014  FINDINGS: Abdominal Aorta  No aneurysm  identified.  Maximum AP  Diameter:  2.4 cm  Maximum TRV  Diameter: 1.8 cm.  Bilateral common iliac arterial ectasia is identified, measuring 1.2 cm maximally on both sides.  IMPRESSION: No abdominal aortic aneurysm identified sonographically.  Mild bilateral iliac arterial ectasia.   Electronically Signed   By: Conchita Paris M.D.   On: 09/24/2014 12:53   Dg Spine Portable 1 View  09/24/2014   CLINICAL DATA:  Surgical level L4-5  EXAM: PORTABLE SPINE - 1 VIEW  COMPARISON:  09/24/2014  FINDINGS: Surgical instrument under the lamina of L4. Second surgical instrument under the lamina of L5. Localization of the  L4-5 disc space which is significantly degenerated.  IMPRESSION: Surgical localization L4-5.   Electronically Signed   By: Franchot Gallo M.D.   On: 09/24/2014 09:02   Dg Spine Portable 1 View  09/24/2014   CLINICAL DATA:  Surgical level L4-5  EXAM: PORTABLE SPINE - 1 VIEW  COMPARISON:  09/24/2014  FINDINGS: Surgical clamps on the L4 and L5 spinous processes.  Normal alignment.  Disc degeneration L4-5  IMPRESSION: Localization of L4 and L5 spinous processes.   Electronically Signed   By: Franchot Gallo M.D.   On: 09/24/2014 08:13   Dg Spine Portable 1 View  09/24/2014   CLINICAL DATA:  Surgical localization.  EXAM: PORTABLE SPINE - 1 VIEW  COMPARISON:  September 16, 2014.  FINDINGS: Single lateral intraoperative view of the lumbar spine demonstrates surgical probes directed toward the posterior spinous processes of L4 and L5. Degenerative disc disease is noted at L3-4 and L4-5.  IMPRESSION: Surgical localization as described above.   Electronically Signed   By: Marijo Conception, M.D.   On: 09/24/2014 08:08    EKG: Orders placed or performed in visit on 06/16/14  . EKG 12-Lead     Hospital Course: Demetrie T Lutzke is a 59 y.o. who was admitted to [facility]. They were brought to the operating room on 09/24/2014 and underwent Procedure(s): CENTRAL DECOMPRESSION  LUMBAR LAMENECTOMYL 4-L5,  FORAMENOTOMY L 4-5 ROOT FOR FORAMIAL STENOSIS, EXCISION OF SCAR TISSUE L 4-5.  Patient tolerated the procedure well and was later transferred to the recovery room and then to the orthopaedic floor for postoperative care.  They were given PO and IV analgesics for pain control following their surgery.  They were given 24 hours of postoperative antibiotics of  Anti-infectives    Start     Dose/Rate Route Frequency Ordered Stop   09/24/14 1600  ceFAZolin (ANCEF) IVPB 1 g/50 mL premix     1 g 100 mL/hr over 30 Minutes Intravenous 3 times per day 09/24/14 1113 09/25/14 0542   09/24/14 0838  polymyxin B 500,000 Units, bacitracin 50,000 Units in sodium chloride irrigation 0.9 % 500 mL irrigation  Status:  Discontinued       As needed 09/24/14 0838 09/24/14 0955   09/24/14 0517  ceFAZolin (ANCEF) IVPB 2 g/50 mL premix     2 g 100 mL/hr over 30 Minutes Intravenous On call to O.R. 09/24/14 [id number] 09/24/14 0735     and started on DVT prophylaxis in the form of Aspirin.   PT walked with the patient. Discharge planning consulted to help with postop disposition and equipment needs.  Patient had a good night on the evening of surgery.  They started to get up OOB with therapy on day one. Abdominal US was ordered due to concern for AAA on preop op films, but no aneurysm was identified. Continued to work with therapy into day two.  Dressing was changed on day two and the incision was clean and dry.   Patient was seen in rounds and was ready to go home.   Diet: Cardiac diet Activity:WBAT Follow-up:in 2 weeks Disposition - Home Discharged Condition: stable   Discharge Instructions    Call MD / Call 911    Complete by:  As directed   If you experience chest pain or shortness of breath, CALL 911 and be transported to the hospital emergency room.  If you develope a fever above 101 F, pus (white drainage) or increased drainage or redness at the wound, or  calf pain, call your surgeon's office.     Constipation  Prevention    Complete by:  As directed   Drink plenty of fluids.  Prune juice may be helpful.  You may use a stool softener, such as Colace (over the counter) 100 mg twice a day.  Use MiraLax (over the counter) for constipation as needed.     Diet - low sodium heart healthy    Complete by:  As directed      Discharge instructions    Complete by:  As directed   Change your dressing daily. For the first three days, cover wound with plastic wrap while showering. Reapply new dressing after shower Shower only, no tub bath. Call if any temperatures greater than 101 or any wound complications: [phone] during the day and ask for Dr. Charlestine Night nurse, Brunilda Payor.     Increase activity slowly as tolerated    Complete by:  As directed             Medication List    STOP taking these medications        ferrous sulfate 325 (65 FE) MG tablet     HYDROcodone-acetaminophen 5-325 MG per tablet  Commonly known as:  NORCO/VICODIN     predniSONE 10 MG tablet  Commonly known as:  DELTASONE      TAKE these medications        amLODipine 5 MG tablet  Commonly known as:  NORVASC  Take 1 tablet (5 mg total) by mouth daily.     docusate sodium 100 MG capsule  Commonly known as:  COLACE  Take 1 capsule (100 mg total) by mouth 2 (two) times daily.     meloxicam 15 MG tablet  Commonly known as:  MOBIC  Take 1 tablet (15 mg total) by mouth daily.     methocarbamol 500 MG tablet  Commonly known as:  ROBAXIN  Take 1 tablet (500 mg total) by mouth every 6 (six) hours as needed for muscle spasms.     oxyCODONE-acetaminophen 5-325 MG per tablet  Commonly known as:  PERCOCET/ROXICET  Take 1-2 tablets by mouth every 4 (four) hours as needed for moderate pain.           Follow-up Information    Follow up with GIOFFRE,RONALD A, MD. Schedule an appointment as soon as possible for a visit in 2 weeks.   Specialty:  Orthopedic Surgery   Contact information:   [address] [place] [address] [phone]       Signed: Ardeen Jourdain, PA-C Orthopaedic Surgery 09/27/2014, 8:42 AM

## 2014-10-11 ENCOUNTER — Ambulatory Visit
Admission: RE | Admit: 2014-10-11 | Discharge: 2014-10-11 | Disposition: A | Discharge status: Home / Self Care | Payer: BLUE CROSS/BLUE SHIELD | Source: Ambulatory Visit | Attending: Orthopedic Surgery | Admitting: Orthopedic Surgery

## 2014-10-11 DIAGNOSIS — I714 Abdominal aortic aneurysm, without rupture, unspecified: Secondary | ICD-10-CM

## 2015-02-10 ENCOUNTER — Ambulatory Visit (INDEPENDENT_AMBULATORY_CARE_PROVIDER_SITE_OTHER): Payer: BLUE CROSS/BLUE SHIELD | Admitting: Family

## 2015-02-10 ENCOUNTER — Encounter: Payer: Self-pay | Admitting: Family

## 2015-02-10 VITALS — BP 140/84 | HR 89 | Temp 98.1°F | Resp 18 | Ht 63.0 in | Wt 115.0 lb

## 2015-02-10 DIAGNOSIS — Z9889 Other specified postprocedural states: Secondary | ICD-10-CM | POA: Diagnosis not present

## 2015-02-10 DIAGNOSIS — M4806 Spinal stenosis, lumbar region: Secondary | ICD-10-CM | POA: Diagnosis not present

## 2015-02-10 DIAGNOSIS — M48062 Spinal stenosis, lumbar region with neurogenic claudication: Secondary | ICD-10-CM

## 2015-02-10 DIAGNOSIS — I10 Essential (primary) hypertension: Secondary | ICD-10-CM

## 2015-02-10 MED ORDER — ACETAMINOPHEN-CODEINE #3 300-30 MG PO TABS: 1.0000 | ORAL_TABLET | Freq: Four times a day (QID) | ORAL | Status: DC | PRN

## 2015-02-10 MED ORDER — METHOCARBAMOL 500 MG PO TABS: 500.0000 mg | ORAL_TABLET | Freq: Four times a day (QID) | ORAL | Status: DC | PRN

## 2015-02-10 MED ORDER — AMLODIPINE BESYLATE 5 MG PO TABS: 5.0000 mg | ORAL_TABLET | Freq: Every day | ORAL | Status: DC

## 2015-02-10 NOTE — Assessment & Plan Note (Signed)
Continues to experience low back pain following laminectomy. Surgical staples removed without complication. Site was clean and dry with minor bleeding noted and easily controlled with direct pressure. Wound with no evidence of infection and covered with a bandage. Start Tylenol 3 as needed for pain and discomfort. Letter provided to patient to limit lifting activities. Follow-up with orthopedics.

## 2015-02-10 NOTE — Assessment & Plan Note (Signed)
Blood pressure remained stable and below goal 140/90 with current regimen of amlodipine. Denies adverse side effects. Continue current dosage of amlodipine. Continue to monitor blood pressure at home as able. Follow up in 3 months.

## 2015-02-10 NOTE — Progress Notes (Signed)
Pre visit review using our clinic review tool, if applicable. No additional management support is needed unless otherwise documented below in the visit note. 

## 2015-02-10 NOTE — Patient Instructions (Addendum)
Thank you for choosing [facility].  Summary/Instructions:  Your prescription(s) have been submitted to your pharmacy or been printed and provided for you. Please take as directed and contact our office if you believe you are having problem(s) with the medication(s) or have any questions.  If your symptoms worsen or fail to improve, please contact our office for further instruction, or in case of emergency go directly to the emergency room at the closest medical facility.    Please follow up with Dr. Darrelyn Hillock.   Keep wound clean with soap and water.

## 2015-02-10 NOTE — Progress Notes (Signed)
Subjective:    Patient ID: Derek Rollins, male    DOB: Aug 29, 1955, 59 y.o.   MRN: [id number]  Chief Complaint  Patient presents with  . Medication Refill    needs refill of all medications    HPI:  Derek Rollins is a 60 y.o. male with a PMH of hypertension, osteoarthritis of the hip, right total hip arthroplasty, and lumbar laminectomy at L4 and L5 who presents today for an office follow up.   1.) Hypertension - Currently managed on amlodipine. Takes the medication as prescribed and denies adverse side. Does not currently take his blood pressure at home. Has not had the medication in the last 2 days.   BP Readings from Last 3 Encounters:  02/10/15 140/84  09/25/14 121/77  09/16/14 132/89     2.) Back pain - Recently had a central decompression and lumbar laminectomy at L4-L5. Everything went well in the surgery and he completed physical therapy. He has been resting and recooperating. Continues to experience the associated symptom of pain located in his lower back that has been going on since surgery or about 4 months. Modifying factors include the percocet which did help with the pain. He indicates that he ran out of medication about 1 month ago and continues to experience pain. Patient was scheduled to follow up with Dr. Josem Kaufmann 2 weeks after surgery. Flexion and extension continue to be uncomfortable. Describes difficulty in grabbing things with his hands.  Pain is described as dull and occasionally sharp. Requesting note to his employer to decrease his work of lifting while working.  Allergies  Allergen Reactions  . Aspirin     Face and mouth numbness     Current Outpatient Prescriptions on File Prior to Visit  Medication Sig Dispense Refill  . docusate sodium (COLACE) 100 MG capsule Take 1 capsule (100 mg total) by mouth 2 (two) times daily. 20 capsule 0  . meloxicam (MOBIC) 15 MG tablet Take 1 tablet (15 mg total) by mouth daily. 30 tablet 0   No current facility-administered  medications on file prior to visit.   Past Medical History  Diagnosis Date  . Hypertension   . Arthritis     Review of Systems  Constitutional: Negative for fever and chills.  Respiratory: Negative for chest tightness and shortness of breath.   Cardiovascular: Negative for chest pain, palpitations and leg swelling.  Musculoskeletal: Positive for back pain.  Neurological: Negative for weakness and numbness.      Objective:    BP 140/84 mmHg  Pulse 89  Temp(Src) 98.1 F (36.7 C) (Oral)  Resp 18  Ht  (1.6 m)  Wt 115 lb (52.164 kg)  BMI 20.38 kg/m2  SpO2 98% Nursing note and vital signs reviewed.  Physical Exam  Constitutional: He is oriented to person, place, and time. He appears well-developed and well-nourished. No distress.  Cardiovascular: Normal rate, regular rhythm, normal heart sounds and intact distal pulses.   Pulmonary/Chest: Effort normal and breath sounds normal.  Neurological: He is alert and oriented to person, place, and time.  Skin: Skin is warm and dry.  Low back scar with good approximation and surgical staples noted with moderate amount of scar tissue noted around the staples. No evidence of infection noted.   Psychiatric: He has a normal mood and affect. His behavior is normal. Judgment and thought content normal.       Assessment & Plan:   Problem List Items Addressed This Visit    None

## 2015-03-15 DIAGNOSIS — M545 Low back pain, unspecified: Secondary | ICD-10-CM

## 2015-03-15 DIAGNOSIS — Z09 Encounter for follow-up examination after completed treatment for conditions other than malignant neoplasm: Secondary | ICD-10-CM

## 2015-03-15 NOTE — Congregational Nurse Program (Signed)
Patient continues to have lumbar pain following laminectomy.   States it is better since starting Prednisone 4 days ago. Concerned about inability to work.  Has applied for disability.  Recommended he apply for SSI.  He has no income and has to borrow money to pay health insurance premium.  Unable to drive.Referred to [facility].

## 2015-03-15 NOTE — Congregational Nurse Program (Signed)
Patient continues to have low back pain but says it has improved since starting Prednisone 4 days ago.  Concerned about not being able to work.  Applied for disability  04/2014.  Has to borrow money to pay [employer].  Recommended he apply for SSI and [employer] to help with transportation problems.

## 2015-03-17 NOTE — Congregational Nurse Program (Signed)
Met patient at [employer] office to help him apply for SSI.

## 2015-07-08 LAB — POCT GLUCOSE (2 HR PP): Glucose 2 Hr PP, POC: 98 mg/dL

## 2015-07-26 ENCOUNTER — Telehealth: Payer: Self-pay | Admitting: Internal Medicine

## 2015-07-26 ENCOUNTER — Ambulatory Visit (INDEPENDENT_AMBULATORY_CARE_PROVIDER_SITE_OTHER): Payer: Self-pay | Admitting: Internal Medicine

## 2015-07-26 ENCOUNTER — Encounter: Payer: Self-pay | Admitting: Internal Medicine

## 2015-07-26 VITALS — BP 156/98 | HR 88 | Ht 63.0 in | Wt 114.0 lb

## 2015-07-26 DIAGNOSIS — F17200 Nicotine dependence, unspecified, uncomplicated: Secondary | ICD-10-CM

## 2015-07-26 DIAGNOSIS — M1611 Unilateral primary osteoarthritis, right hip: Secondary | ICD-10-CM

## 2015-07-26 DIAGNOSIS — M48062 Spinal stenosis, lumbar region with neurogenic claudication: Secondary | ICD-10-CM

## 2015-07-26 DIAGNOSIS — R0989 Other specified symptoms and signs involving the circulatory and respiratory systems: Secondary | ICD-10-CM

## 2015-07-26 DIAGNOSIS — M4806 Spinal stenosis, lumbar region: Secondary | ICD-10-CM

## 2015-07-26 DIAGNOSIS — I10 Essential (primary) hypertension: Secondary | ICD-10-CM

## 2015-07-26 DIAGNOSIS — K029 Dental caries, unspecified: Secondary | ICD-10-CM

## 2015-07-26 DIAGNOSIS — Z23 Encounter for immunization: Secondary | ICD-10-CM

## 2015-07-26 MED ORDER — AMLODIPINE BESYLATE 5 MG PO TABS: 5.0000 mg | ORAL_TABLET | Freq: Every day | ORAL | Status: DC

## 2015-07-26 MED ORDER — MELOXICAM 15 MG PO TABS: 15.0000 mg | ORAL_TABLET | Freq: Every day | ORAL | Status: DC

## 2015-07-26 NOTE — Patient Instructions (Addendum)
Can google "advance directives, [place]"  And bring up form from Secretary of [place]. Print and fill out Or can go to "5 wishes"  Which is also in Spanish and fill out--this costs $5--perhaps easier to use. Designate a Cytogeneticist to speak for you if you are unable to speak for yourself when ill or injured  Please fill out paperwork for [facility] Assistance and make an appointment to see if you can decrease your bill with them  Please fill out paperwork for [facility] scholarship and see if you can get a low cost membership there.

## 2015-07-26 NOTE — Progress Notes (Signed)
   Subjective:    Patient ID: Derek Rollins, male    DOB: Dec 18, 1955, 60 y.o.   MRN: [id number]  HPI   1.  Essential Hypertension:  Has been diagnosed with Hypertension for about 2 years.  Tolerates Amlodipine fine.  His blood pressure was controlled when taking Amlodipine.  Has been out for 3 months.   Seems to understand treatment rather than cure of hypertension. Patient's diet high in vegetables, not so much with fruit--has OJ occasionally. Does have orange card and food stamps:  Has been using to purchase produce at [employer].  2.  Pain in right hip and low back:  Had surgery in Jan. 2015 for hip and April 2015 for these problems.  Since, pain is better, but with weather changes, he has pain. Uses Icy Hot topically when has the discomfort.  Also Tylenol 325 mg, both of which help, but still with some pain.  Does not want to take a higher strength. Pt. Did take Meloxicam at one time--this works well for him and he would like to restart this.  Has also been out for 3 months. Is not very physically active.  Does know how to swim.  Does have a stationary bike with handles that move to improve arm strength as well.  Not clear how long he uses, but states uses for short time every day.    Review of Systems     Objective:   Physical Exam HEENT:  PERRL, EOMI, Discs sharp, TMs pearly gray.  Significant and diffuse dental decay with caries and broken teeth.  Throat without injection Neck:  Supple, no adenopathy, no thyromegaly Chest: CTA CV:  RRR with normal S1 and S2, No S3, S4, or murmur appreciated. + Right carotid bruit, no left carotid bruit.  Right Carotid pulse is decreased.   Radial and DP pulses normal and equal. Abd:  S, NT, No HSM or masses, + BS throughout. LE: No edema       Assessment & Plan:  1.  Hypertension:  Restart Amlodipine 5 mg daily at [facility] pharmacy.  Encouraged 5 servings of veggies and 2 servings of fruit daily  2. Low back and right hip pain:   Has had surgery for both with improvement of pain, but still requiring medication to control pain.  Restart Meloxicam. Discussed swimming or stationary bike.  Gave application for [employer].  3.  Right Carotid Bruit:   Carotid doppler through orange card.  If unable to obtain quickly, need to go through [facility] --application for Financial Assistance with them given, also to case manager.  Pt. Allergic to ASA.     4.  Dental decay--dental referral  5.  HM:  Tdap today.  6.  Tobacco Abuse:  Discussed Nicotine patches --wrote out how to use starting with 21 mg patches.  Discussed important for him to quit with probable carotid stenosis. To call 1-800 QUITNOW.  Info also given to Case Manager.  Asked to see if sons could help as well.

## 2015-07-26 NOTE — Addendum Note (Signed)
Addended by: Marcene Duos on: 07/26/2015 10:40 AM   Modules accepted: Kipp Brood

## 2015-08-01 ENCOUNTER — Other Ambulatory Visit: Payer: Self-pay

## 2015-08-01 NOTE — Addendum Note (Signed)
Addended by: Marcene Duos on: 08/01/2015 09:46 AM   Modules accepted: Kipp Brood

## 2015-08-05 ENCOUNTER — Other Ambulatory Visit (INDEPENDENT_AMBULATORY_CARE_PROVIDER_SITE_OTHER): Payer: Self-pay

## 2015-08-05 DIAGNOSIS — I10 Essential (primary) hypertension: Secondary | ICD-10-CM

## 2015-08-06 LAB — CBC WITH DIFFERENTIAL/PLATELET
BASOS ABS: 0 10*3/uL (ref 0.0–0.2)
Basos: 0 %
EOS (ABSOLUTE): 0.2 10*3/uL (ref 0.0–0.4)
Eos: 3 %
HEMOGLOBIN: 15.9 g/dL (ref 12.6–17.7)
Hematocrit: 47.4 % (ref 37.5–51.0)
IMMATURE GRANS (ABS): 0 10*3/uL (ref 0.0–0.1)
IMMATURE GRANULOCYTES: 0 %
Lymphocytes Absolute: 2.7 10*3/uL (ref 0.7–3.1)
Lymphs: 39 %
MCH: 29.4 pg (ref 26.6–33.0)
MCHC: 33.5 g/dL (ref 31.5–35.7)
MCV: 88 fL (ref 79–97)
MONOCYTES: 8 %
Monocytes Absolute: 0.6 10*3/uL (ref 0.1–0.9)
NEUTROS PCT: 50 %
Neutrophils Absolute: 3.4 10*3/uL (ref 1.4–7.0)
Platelets: 335 10*3/uL (ref 150–379)
RBC: 5.41 x10E6/uL (ref 4.14–5.80)
RDW: 14.2 % (ref 12.3–15.4)
WBC: 6.9 10*3/uL (ref 3.4–10.8)

## 2015-08-06 LAB — LIPID PANEL W/O CHOL/HDL RATIO
CHOLESTEROL TOTAL: 179 mg/dL (ref 100–199)
HDL: 72 mg/dL (ref >39)
LDL Calculated: 87 mg/dL (ref 0–99)
Triglycerides: 99 mg/dL (ref 0–149)
VLDL Cholesterol Cal: 20 mg/dL (ref 5–40)

## 2015-08-06 LAB — COMPREHENSIVE METABOLIC PANEL
ALBUMIN: 4.4 g/dL (ref 3.5–5.5)
ALK PHOS: 108 IU/L (ref 39–117)
ALT: 98 IU/L — AB (ref 0–44)
AST: 86 IU/L — ABNORMAL HIGH (ref 0–40)
Albumin/Globulin Ratio: 1.7 (ref 1.1–2.5)
BILIRUBIN TOTAL: 0.4 mg/dL (ref 0.0–1.2)
BUN/Creatinine Ratio: 12 (ref 9–20)
BUN: 8 mg/dL (ref 6–24)
CHLORIDE: 103 mmol/L (ref 96–106)
CO2: 23 mmol/L (ref 18–29)
Calcium: 9.6 mg/dL (ref 8.7–10.2)
Creatinine, Ser: 0.67 mg/dL — ABNORMAL LOW (ref 0.76–1.27)
GFR calc non Af Amer: 106 mL/min/{1.73_m2} (ref >59)
GFR, EST AFRICAN AMERICAN: 122 mL/min/{1.73_m2} (ref >59)
GLUCOSE: 79 mg/dL (ref 65–99)
Globulin, Total: 2.6 g/dL (ref 1.5–4.5)
Potassium: 5.3 mmol/L — ABNORMAL HIGH (ref 3.5–5.2)
Sodium: 140 mmol/L (ref 134–144)
TOTAL PROTEIN: 7 g/dL (ref 6.0–8.5)

## 2015-08-23 ENCOUNTER — Ambulatory Visit (INDEPENDENT_AMBULATORY_CARE_PROVIDER_SITE_OTHER): Payer: Self-pay | Admitting: Internal Medicine

## 2015-08-23 ENCOUNTER — Encounter: Payer: Self-pay | Admitting: Internal Medicine

## 2015-08-23 VITALS — BP 134/90 | HR 84 | Ht 63.0 in | Wt 114.0 lb

## 2015-08-23 DIAGNOSIS — I10 Essential (primary) hypertension: Secondary | ICD-10-CM

## 2015-08-23 MED ORDER — AMLODIPINE BESYLATE 10 MG PO TABS: 10.0000 mg | ORAL_TABLET | Freq: Every day | ORAL | Status: AC

## 2015-08-23 NOTE — Progress Notes (Signed)
   Subjective:    Patient ID: Derek Rollins, male    DOB: [birth year], 60 y.o.   MRN: [id number]  HPI  1.  Essential Hypertension:  Taking Amlodipine 5 mg daily in the morning after his morning meal.  2. Elevated Liver enzymes:   Had drunk beer in days before--generally no more than 3 beers in a weekend.  No history of hepatitis or jaundice.  Liver enzymes normal April of 2016.  3.  Tobacco Use:  Is working on the 1-800 QUITNOW and utilizing website as well.  Imani  Is working on it with him. He will let me know if they are unsuccessful. We had discussed getting the patches previously. Sallyanne Kustermani is currently on spring break.    Review of Systems     Objective:   Physical Exam NAD Lungs:  CTA CV:  RRR with normal S1 and S2, No S3, S4 or murmur.  Radial pulses normal and equal.  No LE edema Abd:  S, NT, No HSM or mass.  +BS        Assessment & Plan:  Follow up in 3-4 months for CPE   1.  Essential Hypertension:  Much improved, but not at goal.  Increase Amlodipine to 10 mg daily.  Repeat BP check nurse visit in 2 months  2.  Elevated liver enzymes:  Recheck in 2 months with bp check.  3.  Tobacco Use:  Working on the patches.  4.  Joint complaints:  This comes up when we discuss physical activity to treat hypertension.  He states it is too cold for him to get out and be physically active.  Encouraged him to layer up and get outside.  Imani also helping him with [employer] scholarship application--encouraged him to consider pool activity.

## 2015-11-22 ENCOUNTER — Encounter: Payer: Self-pay | Admitting: Internal Medicine

## 2015-11-22 ENCOUNTER — Ambulatory Visit (INDEPENDENT_AMBULATORY_CARE_PROVIDER_SITE_OTHER): Payer: Self-pay | Admitting: Internal Medicine

## 2015-11-22 VITALS — BP 142/78 | HR 86 | Resp 22 | Ht 61.0 in | Wt 113.0 lb

## 2015-11-22 DIAGNOSIS — R748 Abnormal levels of other serum enzymes: Secondary | ICD-10-CM

## 2015-11-22 DIAGNOSIS — Z720 Tobacco use: Secondary | ICD-10-CM

## 2015-11-22 DIAGNOSIS — K029 Dental caries, unspecified: Secondary | ICD-10-CM

## 2015-11-22 DIAGNOSIS — Z0000 Encounter for general adult medical examination without abnormal findings: Secondary | ICD-10-CM

## 2015-11-22 NOTE — Progress Notes (Signed)
Subjective:    Patient ID: Derek Rollins, male    DOB: [birth year], 60 y.o.   MRN: [id number]  HPI   Here for CPE today  Health Maintenance  Last PSA:  Never  Colonoscopy:  Never  Guaiac Cards:  Never  Immunization History  Administered Date(s) Administered  . Influenza Split 07/08/2015  . Tdap 07/26/2015    Current outpatient prescriptions:  .  acetaminophen (TYLENOL) 500 MG tablet, Take 500 mg by mouth every 6 (six) hours as needed., Disp: , Rfl:  .  amLODipine (NORVASC) 10 MG tablet, Take 1 tablet (10 mg total) by mouth daily., Disp: 30 tablet, Rfl: 11 .  docusate sodium (COLACE) 100 MG capsule, Take 1 capsule (100 mg total) by mouth 2 (two) times daily. (Patient not taking: Reported on 08/23/2015), Disp: 20 capsule, Rfl: 0 .  meloxicam (MOBIC) 15 MG tablet, Take 1 tablet (15 mg total) by mouth daily. (Patient not taking: Reported on 11/22/2015), Disp: 30 tablet, Rfl: 6   Allergies  Allergen Reactions  . Aspirin     Face and mouth numbness    Patient Active Problem List   Diagnosis Date Noted  . S/P lumbar laminectomy 02/10/2015  . Spinal stenosis, lumbar region, with neurogenic claudication 09/24/2014  . Arthralgia 08/12/2014  . H/O total hip arthroplasty 06/28/2014  . Essential hypertension 04/22/2014  . Osteoarthritis of right hip 04/08/2014  . Tobacco use disorder 04/08/2014   Past Medical History  Diagnosis Date  . Hypertension   . Arthritis    Past Surgical History  Procedure Laterality Date  . Lumbar disc surgery  23 years ago  . Total hip arthroplasty Right 06/28/2014    Procedure: RIGHT TOTAL HIP ARTHROPLASTY;  Surgeon: Jacki Conesonald A Gioffre, MD;  Location: [facility];  Service: Orthopedics;  Laterality: Right;  . Lumbar laminectomy/decompression microdiscectomy Left 09/24/2014    Procedure: CENTRAL DECOMPRESSION  LUMBAR LAMENECTOMYL 4-L5, FORAMENOTOMY L 4-5 ROOT FOR FORAMIAL STENOSIS, EXCISION OF SCAR TISSUE L 4-5;  Surgeon: Ranee Gosselinonald Gioffre, MD;  Location: [facility];   Service: Orthopedics;  Laterality: Left;  . Joint replacement      Right Total Hip Arthroplasty   Family History:  Unremarkable--all siblings and 2 sons living.  Social History   Social History  . Marital Status: Married    Spouse Name: Separated  . Number of Children: 2  . Years of Education: 12   Occupational History  . Unemployed      Working on achieving disability   Social History Main Topics  . Smoking status: Current Every Day Smoker -- 0.50 packs/day for 24 years    Types: Cigarettes  . Smokeless tobacco: Never Used     Comment: Did call quit line with a student helping, but for some reason thought he was instructed to call 911.  Discussed that is a message only to call if having an emergency and needs to listen to entire message next time.  Interpreter stated she would help him  . Alcohol Use: 0.0 oz/week    0 Standard drinks or equivalent per week     Comment: Occasional  on weekends--maybe 3 beers  . Drug Use: No  . Sexual Activity: Not on file   Other Topics Concern  . Not on file   Social History Narrative   Born and raised in [place].   Is now separated from his wife and lives alone.   Has been unable to support his two sons, so they live with his brother in [place]  Fun: Can't do much now - sing karoke.     .     Review of Systems  Constitutional: Negative for appetite change and fatigue.  HENT: Positive for dental problem. Negative for hearing loss.        No dental care for about 5 years.  Eyes: Negative for visual disturbance.  Respiratory: Negative for cough and shortness of breath.   Cardiovascular: Negative for chest pain, palpitations and leg swelling.  Gastrointestinal: Negative for nausea, abdominal pain, diarrhea, constipation and blood in stool.       No melena  Genitourinary: Negative for urgency, frequency, discharge, penile swelling, scrotal swelling, difficulty urinating, penile pain and testicular pain.  Musculoskeletal: Positive  for arthralgias. Negative for back pain.  Neurological: Negative for dizziness, light-headedness and headaches.  Psychiatric/Behavioral: Negative for suicidal ideas and dysphoric mood. The patient is not nervous/anxious.        Objective:   Physical Exam  Constitutional: He is oriented to person, place, and time. He appears well-developed and well-nourished.  Wirey build  HENT:  Head: Normocephalic and atraumatic.  Right Ear: Hearing, tympanic membrane, external ear and ear canal normal.  Left Ear: Hearing, tympanic membrane, external ear and ear canal normal.  Nose: Nose normal.  Mouth/Throat: Uvula is midline, oropharynx is clear and moist and mucous membranes are normal. No oropharyngeal exudate.  Significant dental decay and gingival recession.  Eyes: Conjunctivae and EOM are normal. Pupils are equal, round, and reactive to light.  Arcus senilis bilaterally Discs sharp bilaterally  Neck: Normal range of motion and full passive range of motion without pain. Neck supple. No thyroid mass and no thyromegaly present.  Cardiovascular: Normal rate, regular rhythm, S1 normal, S2 normal, normal heart sounds and intact distal pulses.  Exam reveals no S3, no S4 and no friction rub.   No murmur heard. No carotid bruits  Pulmonary/Chest: Effort normal and breath sounds normal.  Abdominal: Soft. Bowel sounds are normal. He exhibits no mass. There is no hepatosplenomegaly. There is no tenderness. Hernia confirmed negative in the right inguinal area and confirmed negative in the left inguinal area.  Genitourinary: Rectum normal, prostate normal and penis normal. Rectal exam shows no mass. Guaiac negative stool. Prostate is not tender. Right testis shows no mass and no tenderness. Left testis shows no mass and no tenderness. Uncircumcised.  Musculoskeletal: Normal range of motion. He exhibits no edema or tenderness.  Lymphadenopathy:    He has no cervical adenopathy.       Right: No inguinal and no  supraclavicular adenopathy present.       Left: No inguinal adenopathy present.  Neurological: He is alert and oriented to person, place, and time. He has normal strength and normal reflexes. No cranial nerve deficit. Gait normal.  Skin: Skin is warm and dry. No lesion and no rash noted.  Psychiatric: He has a normal mood and affect. His behavior is normal. Judgment and thought content normal.          Assessment & Plan:  1.  CPE:  Guaiac Cards x3 to complete and return in 2 weeks. Unable to get patients into screening colonoscopies at this time PSA  2. Essential Hypertension:  Return for bp check only in 2 weeks.  3.  Dental Decay:  Dental referral.  4.  History of elevated liver enzymes:  Recheck hepatic  profile  5.  Tobacco Abuse:  Went over quit line again.  Interpreter to help him

## 2015-11-22 NOTE — Patient Instructions (Signed)
[phone]  Or [phone]

## 2015-11-23 LAB — HEPATIC FUNCTION PANEL
ALK PHOS: 68 IU/L (ref 39–117)
ALT: 23 IU/L (ref 0–44)
AST: 25 IU/L (ref 0–40)
Albumin: 4.2 g/dL (ref 3.5–5.5)
BILIRUBIN TOTAL: 0.5 mg/dL (ref 0.0–1.2)
BILIRUBIN, DIRECT: 0.15 mg/dL (ref 0.00–0.40)
Total Protein: 7.2 g/dL (ref 6.0–8.5)

## 2015-11-23 LAB — PSA: PROSTATE SPECIFIC AG, SERUM: 1.1 ng/mL (ref 0.0–4.0)

## 2015-12-05 ENCOUNTER — Other Ambulatory Visit (INDEPENDENT_AMBULATORY_CARE_PROVIDER_SITE_OTHER): Payer: Self-pay

## 2015-12-05 DIAGNOSIS — Z1211 Encounter for screening for malignant neoplasm of colon: Secondary | ICD-10-CM

## 2015-12-05 LAB — POC HEMOCCULT BLD/STL (HOME/3-CARD/SCREEN)
Card #2 Fecal Occult Blod, POC: NEGATIVE
FECAL OCCULT BLD: NEGATIVE
FECAL OCCULT BLD: NEGATIVE

## 2015-12-06 ENCOUNTER — Ambulatory Visit: Payer: Self-pay

## 2015-12-06 VITALS — BP 160/82 | HR 85

## 2015-12-06 DIAGNOSIS — I10 Essential (primary) hypertension: Secondary | ICD-10-CM

## 2017-04-05 ENCOUNTER — Other Ambulatory Visit: Payer: Self-pay | Admitting: Internal Medicine

## 2017-04-05 ENCOUNTER — Encounter: Payer: Self-pay | Admitting: Internal Medicine

## 2017-04-05 ENCOUNTER — Ambulatory Visit (INDEPENDENT_AMBULATORY_CARE_PROVIDER_SITE_OTHER): Payer: Self-pay | Admitting: Internal Medicine

## 2017-04-05 VITALS — BP 110/64 | HR 75 | Resp 12 | Ht 61.0 in | Wt 106.5 lb

## 2017-04-05 DIAGNOSIS — M2550 Pain in unspecified joint: Secondary | ICD-10-CM

## 2017-04-05 DIAGNOSIS — Z23 Encounter for immunization: Secondary | ICD-10-CM

## 2017-04-05 DIAGNOSIS — F17200 Nicotine dependence, unspecified, uncomplicated: Secondary | ICD-10-CM

## 2017-04-05 DIAGNOSIS — I10 Essential (primary) hypertension: Secondary | ICD-10-CM

## 2017-04-05 DIAGNOSIS — M48062 Spinal stenosis, lumbar region with neurogenic claudication: Secondary | ICD-10-CM

## 2017-04-05 DIAGNOSIS — Z79899 Other long term (current) drug therapy: Secondary | ICD-10-CM

## 2017-04-05 MED ORDER — MELOXICAM 7.5 MG PO TABS: ORAL_TABLET | ORAL | 4 refills | Status: DC

## 2017-04-05 NOTE — Progress Notes (Signed)
   Subjective:    Patient ID: Derek Rollins, male    DOB: 03-14-1956, 61 y.o.   MRN: [id number]  HPI   Has not been seen since 12/2015.    1.  Essential Hypertension:  Should have run out of Amlodipine in March of 2018, but sounds like he has been taking 1/2 tab daily.  Not clear how he has been able to continue to fill.   Later, he admits he has not taken Amlodipine since July of 2018.   States he has been exercising more and eating healthier to keep his bp in a normal range.    2.  Right hip pain intermittently.  Points to his upper lateral thigh.  Has discomfort once or twice weekly.  Lasts about 15-20 minutes.  Notes more so when walking or bending over frequently.  Has not taken anything for pain.  States Meloxicam helped in the past. Status post right hip arthroplasty in 2016,  3.  Low back pain:  Where his previous scar is.  Does not seem like this radiates.  Every 2 weeks, has a flare.  Lasts about 30 minutes. Takes Tylenol 500 mg and lies down with good result.    4.  Hurts all over with this cool wet weather.  No swelling or redness of joints.  5.  Tobacco abuse:  Used patches before--made him nauseated and dizzy.  Not sure what mg of patch this was.  He just took off the patch and started smoking again.      Current Meds  Medication Sig  . acetaminophen (TYLENOL) 500 MG tablet Take 500 mg by mouth every 6 (six) hours as needed.   Allergies  Allergen Reactions  . Aspirin     Face and mouth numbness     Review of Systems     Objective:   Physical Exam NAD HEENT: PERRL, EOMI, discs sharp, TMs pearly gray, throat without injection. Neck:  Supple, No adenopathy Chest:  CTA CV:  RRR with normal S1 and S2, No S3, S4 or murmur. Radial and DP pulses normal and equal. Abd:  S, NT, No HSM or mass, + BS MS:  Tender over small vertical scar over lumbar spinous process region.  No erythema or swelling.   No limp.  Full ROM of right hip, mildly tender over right greater  trochanter       Assessment & Plan:  1.  Essential Hypertension:  BP currently fine without Amlodipine now for 3 months by history. Continue reported lifestyle changes.  2.  Right thigh pain:  Check CBC and CMP with chronic use of Meloxicam and refill Meloxicam  3.  General arthralgias/MS pain with weather pain:  As in #2  4.  Low back pain:  As in #2  5.  Tobacco abuse: Discussed starting with 14 mg Nicotine patches to avoid dizziness.  Instructions given.  6.  Health Maintenance:  [facility] at [facility] for free.

## 2017-04-05 NOTE — Patient Instructions (Signed)
Tobacco Cessation:   1800QUITNOW or [phone], the former for support and possibly free nicotine patches/gum and support; the latter for [facility] Smoking cessation class. Get rid of all smoking supplies:  Cigarettes, lighters, ashtrays--no stashes just in case at home if you are serious.  For nicotine patches:  Stop smoking anything the day you start the first patch Start with 14 mg patch and reapply new to different area of skin every 24 hours for 30 days. Then 7 mg patch changed every 24 hours for 14 days. .Marland Kitchen

## 2017-04-06 LAB — CBC WITH DIFFERENTIAL/PLATELET
Basophils Absolute: 0 10*3/uL (ref 0.0–0.2)
Basos: 0 %
EOS (ABSOLUTE): 0.2 10*3/uL (ref 0.0–0.4)
EOS: 3 %
HEMATOCRIT: 44.2 % (ref 37.5–51.0)
Hemoglobin: 14.9 g/dL (ref 13.0–17.7)
Immature Grans (Abs): 0 10*3/uL (ref 0.0–0.1)
Immature Granulocytes: 0 %
LYMPHS ABS: 3.3 10*3/uL — AB (ref 0.7–3.1)
Lymphs: 47 %
MCH: 29.8 pg (ref 26.6–33.0)
MCHC: 33.7 g/dL (ref 31.5–35.7)
MCV: 88 fL (ref 79–97)
MONOS ABS: 0.6 10*3/uL (ref 0.1–0.9)
Monocytes: 9 %
NEUTROS ABS: 2.9 10*3/uL (ref 1.4–7.0)
Neutrophils: 41 %
Platelets: 323 10*3/uL (ref 150–379)
RBC: 5 x10E6/uL (ref 4.14–5.80)
RDW: 14.5 % (ref 12.3–15.4)
WBC: 7.1 10*3/uL (ref 3.4–10.8)

## 2017-04-06 LAB — COMPREHENSIVE METABOLIC PANEL
ALBUMIN: 4.5 g/dL (ref 3.6–4.8)
ALT: 15 IU/L (ref 0–44)
AST: 22 IU/L (ref 0–40)
Albumin/Globulin Ratio: 1.8 (ref 1.2–2.2)
Alkaline Phosphatase: 62 IU/L (ref 39–117)
BUN / CREAT RATIO: 16 (ref 10–24)
BUN: 12 mg/dL (ref 8–27)
Bilirubin Total: 0.4 mg/dL (ref 0.0–1.2)
CO2: 23 mmol/L (ref 20–29)
CREATININE: 0.77 mg/dL (ref 0.76–1.27)
Calcium: 9.2 mg/dL (ref 8.6–10.2)
Chloride: 105 mmol/L (ref 96–106)
GFR calc non Af Amer: 99 mL/min/{1.73_m2} (ref >59)
GFR, EST AFRICAN AMERICAN: 114 mL/min/{1.73_m2} (ref >59)
Globulin, Total: 2.5 g/dL (ref 1.5–4.5)
Glucose: 70 mg/dL (ref 65–99)
Potassium: 4.7 mmol/L (ref 3.5–5.2)
Sodium: 142 mmol/L (ref 134–144)
TOTAL PROTEIN: 7 g/dL (ref 6.0–8.5)

## 2017-04-21 ENCOUNTER — Encounter: Payer: Self-pay | Admitting: Internal Medicine

## 2017-06-25 NOTE — Telephone Encounter (Signed)
Error

## 2017-08-06 ENCOUNTER — Ambulatory Visit: Payer: Self-pay | Admitting: Internal Medicine

## 2017-08-26 ENCOUNTER — Ambulatory Visit: Payer: Self-pay | Admitting: Internal Medicine

## 2017-08-26 ENCOUNTER — Encounter: Payer: Self-pay | Admitting: Internal Medicine

## 2017-08-26 VITALS — BP 138/80 | HR 82 | Resp 12 | Ht 61.0 in | Wt 107.0 lb

## 2017-08-26 DIAGNOSIS — I10 Essential (primary) hypertension: Secondary | ICD-10-CM

## 2017-08-26 DIAGNOSIS — M1611 Unilateral primary osteoarthritis, right hip: Secondary | ICD-10-CM

## 2017-08-26 DIAGNOSIS — M2550 Pain in unspecified joint: Secondary | ICD-10-CM

## 2017-08-26 DIAGNOSIS — F17200 Nicotine dependence, unspecified, uncomplicated: Secondary | ICD-10-CM

## 2017-08-26 MED ORDER — MELOXICAM 7.5 MG PO TABS: ORAL_TABLET | ORAL | 6 refills | Status: AC

## 2017-08-26 NOTE — Progress Notes (Signed)
   Subjective:    Patient ID: Derek Rollins, male    DOB: [birth year], 62 y.o.   MRN: [id number]  HPI   1. History of Essential Hypertension:  Was off amlodipine for about 3 months and bp was good at last visit in late fall.  He is not having headaches. No chest pain. No dyspnea.  No ankle swelling. He would be okay with restart of Amlodipine if needed as bp up today.  2.  Tobacco Abuse:  Did not stop smoking.  Smoking 4 cigarettes daily. Not ready to quit.  3.  Dental Decay:  Not ready to contemplate dental work.  Feels he cannot afford dentures currently.  4.  Hip and other joint complaints:  Intermittent use of Meloxicam 7.5 mg. Helps.  He has had more pain with the cold rainy weather.  Current Meds  Medication Sig  . acetaminophen (TYLENOL) 500 MG tablet Take 500 mg by mouth every 6 (six) hours as needed.  . meloxicam (MOBIC) 7.5 MG tablet 1 tab by mouth daily with meal as needed for pain    Allergies  Allergen Reactions  . Aspirin     Face and mouth numbness        Review of Systems     Objective:   Physical Exam NAD HEENT:  PERRL, EOMI, discs sharp.  Diffuse dental decay. Neck:  Supple, No adenopathy Chest:  CTA CV:  RRR with normal S1 and S2, No S3, S4 or murmur, radial and DP pulses normal and equal LE:  No edema     Assessment & Plan:  1.  Previous hypertension:  Hold on restarting Amlodipine.  Return in 3 months for bp check.  If showing continued upward trend, will likely restart then.  Encouraged regular physical activity and diet high in fruits and veggies.  2.  Tobacco abuse:  Encouraged use of nicotine lozenges when has urge to smoke (with dental decay, gum not a good choice)  He does not seem interested.  3.   Dental decay:  He will let me know when ready for dental referral.  4.  OA/joint complaints:  Low dose Meloxicam with meal as needed.  5.  HM:  CPE in 6 months.

## 2017-11-26 ENCOUNTER — Ambulatory Visit: Payer: Self-pay

## 2017-11-26 VITALS — BP 134/82 | HR 98

## 2017-11-26 DIAGNOSIS — I10 Essential (primary) hypertension: Secondary | ICD-10-CM

## 2017-11-26 NOTE — Progress Notes (Signed)
Patient BP remains in normal range without taking any BP medication. Per Dr. Delrae AlfredMulberry continue as he has been without meds and will check it again at his next OV in sept. Patient verbalized understanding.

## 2018-02-28 ENCOUNTER — Encounter: Payer: Self-pay | Admitting: Internal Medicine

## 2021-04-15 ENCOUNTER — Encounter (HOSPITAL_COMMUNITY)
Admission: EM | Disposition: A | Discharge status: Home / Self Care | Payer: Self-pay | Source: Home / Self Care | Attending: Vascular Surgery

## 2021-04-15 ENCOUNTER — Emergency Department (HOSPITAL_COMMUNITY): Payer: Medicare Other

## 2021-04-15 ENCOUNTER — Inpatient Hospital Stay (HOSPITAL_COMMUNITY)
Admission: EM | Admit: 2021-04-15 | Discharge: 2021-04-17 | DRG: 254 | Disposition: A | Discharge status: Home / Self Care | Payer: Medicare Other | Attending: Vascular Surgery | Admitting: Vascular Surgery

## 2021-04-15 ENCOUNTER — Encounter (HOSPITAL_COMMUNITY): Payer: Self-pay

## 2021-04-15 ENCOUNTER — Emergency Department (HOSPITAL_COMMUNITY): Payer: Medicare Other | Admitting: Anesthesiology

## 2021-04-15 ENCOUNTER — Other Ambulatory Visit: Payer: Self-pay

## 2021-04-15 DIAGNOSIS — F17210 Nicotine dependence, cigarettes, uncomplicated: Secondary | ICD-10-CM | POA: Diagnosis present

## 2021-04-15 DIAGNOSIS — Z886 Allergy status to analgesic agent status: Secondary | ICD-10-CM | POA: Diagnosis not present

## 2021-04-15 DIAGNOSIS — I998 Other disorder of circulatory system: Principal | ICD-10-CM | POA: Diagnosis present

## 2021-04-15 DIAGNOSIS — Z79899 Other long term (current) drug therapy: Secondary | ICD-10-CM | POA: Diagnosis not present

## 2021-04-15 DIAGNOSIS — M1990 Unspecified osteoarthritis, unspecified site: Secondary | ICD-10-CM | POA: Diagnosis present

## 2021-04-15 DIAGNOSIS — Z791 Long term (current) use of non-steroidal anti-inflammatories (NSAID): Secondary | ICD-10-CM

## 2021-04-15 DIAGNOSIS — Z20822 Contact with and (suspected) exposure to covid-19: Secondary | ICD-10-CM | POA: Diagnosis present

## 2021-04-15 DIAGNOSIS — I743 Embolism and thrombosis of arteries of the lower extremities: Secondary | ICD-10-CM | POA: Diagnosis present

## 2021-04-15 DIAGNOSIS — Z96641 Presence of right artificial hip joint: Secondary | ICD-10-CM | POA: Diagnosis present

## 2021-04-15 DIAGNOSIS — I10 Essential (primary) hypertension: Secondary | ICD-10-CM | POA: Diagnosis present

## 2021-04-15 DIAGNOSIS — Z9889 Other specified postprocedural states: Secondary | ICD-10-CM

## 2021-04-15 DIAGNOSIS — M79661 Pain in right lower leg: Secondary | ICD-10-CM | POA: Diagnosis present

## 2021-04-15 DIAGNOSIS — I779 Disorder of arteries and arterioles, unspecified: Secondary | ICD-10-CM

## 2021-04-15 HISTORY — PX: THROMBECTOMY OF BYPASS GRAFT FEMORAL- TIBIAL ARTERY: SHX6904

## 2021-04-15 HISTORY — PX: FASCIOTOMY: SHX132

## 2021-04-15 LAB — BASIC METABOLIC PANEL
Anion gap: 12 (ref 5–15)
BUN: 7 mg/dL — ABNORMAL LOW (ref 8–23)
CO2: 22 mmol/L (ref 22–32)
Calcium: 9.1 mg/dL (ref 8.9–10.3)
Chloride: 98 mmol/L (ref 98–111)
Creatinine, Ser: 0.78 mg/dL (ref 0.61–1.24)
GFR, Estimated: >60 mL/min (ref >60)
Glucose, Bld: 111 mg/dL — ABNORMAL HIGH (ref 70–99)
Potassium: 4.5 mmol/L (ref 3.5–5.1)
Sodium: 132 mmol/L — ABNORMAL LOW (ref 135–145)

## 2021-04-15 LAB — CBC WITH DIFFERENTIAL/PLATELET
Abs Immature Granulocytes: 0.03 10*3/uL (ref 0.00–0.07)
Basophils Absolute: 0 10*3/uL (ref 0.0–0.1)
Basophils Relative: 0 %
Eosinophils Absolute: 0 10*3/uL (ref 0.0–0.5)
Eosinophils Relative: 0 %
HCT: 48.8 % (ref 39.0–52.0)
Hemoglobin: 17.1 g/dL — ABNORMAL HIGH (ref 13.0–17.0)
Immature Granulocytes: 0 %
Lymphocytes Relative: 15 %
Lymphs Abs: 1.4 10*3/uL (ref 0.7–4.0)
MCH: 29.2 pg (ref 26.0–34.0)
MCHC: 35 g/dL (ref 30.0–36.0)
MCV: 83.3 fL (ref 80.0–100.0)
Monocytes Absolute: 0.5 10*3/uL (ref 0.1–1.0)
Monocytes Relative: 5 %
Neutro Abs: 7.3 10*3/uL (ref 1.7–7.7)
Neutrophils Relative %: 80 %
Platelets: 309 10*3/uL (ref 150–400)
RBC: 5.86 MIL/uL — ABNORMAL HIGH (ref 4.22–5.81)
RDW: 12.8 % (ref 11.5–15.5)
WBC: 9.3 10*3/uL (ref 4.0–10.5)
nRBC: 0 % (ref 0.0–0.2)

## 2021-04-15 LAB — PROTIME-INR
INR: 1 (ref 0.8–1.2)
Prothrombin Time: 12.8 seconds (ref 11.4–15.2)

## 2021-04-15 LAB — RESP PANEL BY RT-PCR (FLU A&B, COVID) ARPGX2
Influenza A by PCR: NEGATIVE
Influenza B by PCR: NEGATIVE
SARS Coronavirus 2 by RT PCR: NEGATIVE

## 2021-04-15 LAB — TYPE AND SCREEN
ABO/RH(D): B POS
Antibody Screen: NEGATIVE

## 2021-04-15 SURGERY — THROMBECTOMY, BYPASS GRAFT, ARTERIAL, FEMORAL TO TIBIAL
Anesthesia: General | Site: Leg Lower | Laterality: Right

## 2021-04-15 MED ORDER — FENTANYL CITRATE (PF) 250 MCG/5ML IJ SOLN
INTRAMUSCULAR | Status: AC
  Filled 2021-04-15: qty 5

## 2021-04-15 MED ORDER — MIDAZOLAM HCL 5 MG/5ML IJ SOLN
INTRAMUSCULAR | Status: DC | PRN
  Administered 2021-04-15: 1 mg via INTRAVENOUS

## 2021-04-15 MED ORDER — DOCUSATE SODIUM 100 MG PO CAPS
100.0000 mg | ORAL_CAPSULE | Freq: Every day | ORAL | Status: DC
  Administered 2021-04-16: 100 mg via ORAL
  Filled 2021-04-15: qty 1

## 2021-04-15 MED ORDER — PHENOL 1.4 % MT LIQD: 1.0000 | OROMUCOSAL | Status: DC | PRN

## 2021-04-15 MED ORDER — HYDRALAZINE HCL 20 MG/ML IJ SOLN: 5.0000 mg | INTRAMUSCULAR | Status: DC | PRN

## 2021-04-15 MED ORDER — PHENYLEPHRINE HCL-NACL 20-0.9 MG/250ML-% IV SOLN
INTRAVENOUS | Status: DC | PRN
  Administered 2021-04-15: 50 ug/min via INTRAVENOUS

## 2021-04-15 MED ORDER — ONDANSETRON HCL 4 MG/2ML IJ SOLN
INTRAMUSCULAR | Status: DC | PRN
  Administered 2021-04-15: 4 mg via INTRAVENOUS

## 2021-04-15 MED ORDER — SODIUM CHLORIDE 0.9 % IV SOLN: 500.0000 mL | Freq: Once | INTRAVENOUS | Status: DC | PRN

## 2021-04-15 MED ORDER — ROSUVASTATIN CALCIUM 20 MG PO TABS
20.0000 mg | ORAL_TABLET | Freq: Every day | ORAL | Status: DC
  Administered 2021-04-16 – 2021-04-17 (×2): 20 mg via ORAL
  Filled 2021-04-15 (×2): qty 1

## 2021-04-15 MED ORDER — HEPARIN 6000 UNIT IRRIGATION SOLUTION
Status: DC | PRN
  Administered 2021-04-15: 1

## 2021-04-15 MED ORDER — HYDRALAZINE HCL 20 MG/ML IJ SOLN
10.0000 mg | Freq: Once | INTRAMUSCULAR | Status: AC
  Administered 2021-04-15: 10 mg via INTRAVENOUS
  Filled 2021-04-15: qty 1

## 2021-04-15 MED ORDER — PANTOPRAZOLE SODIUM 40 MG PO TBEC
40.0000 mg | DELAYED_RELEASE_TABLET | Freq: Every day | ORAL | Status: DC
  Administered 2021-04-16 – 2021-04-17 (×2): 40 mg via ORAL
  Filled 2021-04-15 (×2): qty 1

## 2021-04-15 MED ORDER — 0.9 % SODIUM CHLORIDE (POUR BTL) OPTIME
TOPICAL | Status: DC | PRN
  Administered 2021-04-15: 2000 mL

## 2021-04-15 MED ORDER — AMLODIPINE BESYLATE 10 MG PO TABS
10.0000 mg | ORAL_TABLET | Freq: Every day | ORAL | Status: DC
  Administered 2021-04-16 – 2021-04-17 (×2): 10 mg via ORAL
  Filled 2021-04-15 (×2): qty 1

## 2021-04-15 MED ORDER — IOHEXOL 350 MG/ML SOLN
100.0000 mL | Freq: Once | INTRAVENOUS | Status: AC | PRN
  Administered 2021-04-15: 100 mL via INTRAVENOUS

## 2021-04-15 MED ORDER — HEPARIN BOLUS VIA INFUSION
3000.0000 [IU] | Freq: Once | INTRAVENOUS | Status: AC
  Administered 2021-04-15: 3000 [IU] via INTRAVENOUS
  Filled 2021-04-15: qty 3000

## 2021-04-15 MED ORDER — PROPOFOL 10 MG/ML IV BOLUS
INTRAVENOUS | Status: AC
  Filled 2021-04-15: qty 20

## 2021-04-15 MED ORDER — HEPARIN SODIUM (PORCINE) 1000 UNIT/ML IJ SOLN
INTRAMUSCULAR | Status: DC | PRN
  Administered 2021-04-15: 6000 [IU] via INTRAVENOUS

## 2021-04-15 MED ORDER — ONDANSETRON HCL 4 MG/2ML IJ SOLN: 4.0000 mg | Freq: Four times a day (QID) | INTRAMUSCULAR | Status: DC | PRN

## 2021-04-15 MED ORDER — METOPROLOL TARTRATE 5 MG/5ML IV SOLN: 2.0000 mg | INTRAVENOUS | Status: DC | PRN

## 2021-04-15 MED ORDER — SUGAMMADEX SODIUM 200 MG/2ML IV SOLN
INTRAVENOUS | Status: DC | PRN
  Administered 2021-04-15: 100 mg via INTRAVENOUS

## 2021-04-15 MED ORDER — SODIUM CHLORIDE 0.9 % IV SOLN: INTRAVENOUS | Status: DC

## 2021-04-15 MED ORDER — POTASSIUM CHLORIDE CRYS ER 20 MEQ PO TBCR: 20.0000 meq | EXTENDED_RELEASE_TABLET | Freq: Every day | ORAL | Status: DC | PRN

## 2021-04-15 MED ORDER — ACETAMINOPHEN 650 MG RE SUPP: 325.0000 mg | RECTAL | Status: DC | PRN

## 2021-04-15 MED ORDER — OXYCODONE-ACETAMINOPHEN 5-325 MG PO TABS
1.0000 | ORAL_TABLET | ORAL | Status: DC | PRN
  Administered 2021-04-16 – 2021-04-17 (×4): 2 via ORAL
  Filled 2021-04-15 (×4): qty 2

## 2021-04-15 MED ORDER — ROCURONIUM BROMIDE 10 MG/ML (PF) SYRINGE
PREFILLED_SYRINGE | INTRAVENOUS | Status: DC | PRN
  Administered 2021-04-15: 50 mg via INTRAVENOUS

## 2021-04-15 MED ORDER — PROPOFOL 10 MG/ML IV BOLUS
INTRAVENOUS | Status: DC | PRN
  Administered 2021-04-15: 100 mg via INTRAVENOUS

## 2021-04-15 MED ORDER — CEFAZOLIN SODIUM-DEXTROSE 2-4 GM/100ML-% IV SOLN
2.0000 g | Freq: Three times a day (TID) | INTRAVENOUS | Status: AC
  Administered 2021-04-16 (×2): 2 g via INTRAVENOUS
  Filled 2021-04-15 (×2): qty 100

## 2021-04-15 MED ORDER — MIDAZOLAM HCL 2 MG/2ML IJ SOLN
INTRAMUSCULAR | Status: AC
  Filled 2021-04-15: qty 2

## 2021-04-15 MED ORDER — MAGNESIUM SULFATE 2 GM/50ML IV SOLN: 2.0000 g | Freq: Every day | INTRAVENOUS | Status: DC | PRN

## 2021-04-15 MED ORDER — LIDOCAINE 2% (20 MG/ML) 5 ML SYRINGE
INTRAMUSCULAR | Status: DC | PRN
  Administered 2021-04-15: 60 mg via INTRAVENOUS

## 2021-04-15 MED ORDER — MORPHINE SULFATE (PF) 2 MG/ML IV SOLN: 2.0000 mg | INTRAVENOUS | Status: DC | PRN

## 2021-04-15 MED ORDER — FENTANYL CITRATE (PF) 100 MCG/2ML IJ SOLN: 25.0000 ug | INTRAMUSCULAR | Status: DC | PRN

## 2021-04-15 MED ORDER — DEXAMETHASONE SODIUM PHOSPHATE 10 MG/ML IJ SOLN
INTRAMUSCULAR | Status: DC | PRN
  Administered 2021-04-15: 5 mg via INTRAVENOUS

## 2021-04-15 MED ORDER — LABETALOL HCL 5 MG/ML IV SOLN: 10.0000 mg | INTRAVENOUS | Status: DC | PRN

## 2021-04-15 MED ORDER — ACETAMINOPHEN 325 MG PO TABS: 325.0000 mg | ORAL_TABLET | ORAL | Status: DC | PRN

## 2021-04-15 MED ORDER — FENTANYL CITRATE (PF) 100 MCG/2ML IJ SOLN
INTRAMUSCULAR | Status: DC | PRN
  Administered 2021-04-15: 100 ug via INTRAVENOUS
  Administered 2021-04-15 (×3): 25 ug via INTRAVENOUS

## 2021-04-15 MED ORDER — CEFAZOLIN SODIUM-DEXTROSE 2-4 GM/100ML-% IV SOLN
INTRAVENOUS | Status: AC
  Filled 2021-04-15: qty 100

## 2021-04-15 MED ORDER — GUAIFENESIN-DM 100-10 MG/5ML PO SYRP: 15.0000 mL | ORAL_SOLUTION | ORAL | Status: DC | PRN

## 2021-04-15 MED ORDER — HYDROMORPHONE HCL 1 MG/ML IJ SOLN
1.0000 mg | Freq: Once | INTRAMUSCULAR | Status: AC
  Administered 2021-04-15: 1 mg via INTRAVENOUS
  Filled 2021-04-15: qty 1

## 2021-04-15 MED ORDER — HEPARIN (PORCINE) 25000 UT/250ML-% IV SOLN
900.0000 [IU]/h | INTRAVENOUS | Status: DC
  Administered 2021-04-15: 900 [IU]/h via INTRAVENOUS
  Filled 2021-04-15: qty 250

## 2021-04-15 MED ORDER — HEPARIN (PORCINE) 25000 UT/250ML-% IV SOLN
900.0000 [IU]/h | INTRAVENOUS | Status: DC
  Administered 2021-04-16 – 2021-04-17 (×2): 900 [IU]/h via INTRAVENOUS
  Filled 2021-04-15 (×3): qty 250

## 2021-04-15 MED ORDER — ALUM & MAG HYDROXIDE-SIMETH 200-200-20 MG/5ML PO SUSP: 15.0000 mL | ORAL | Status: DC | PRN

## 2021-04-15 MED ORDER — LACTATED RINGERS IV SOLN: INTRAVENOUS | Status: DC | PRN

## 2021-04-15 MED ORDER — ACETAMINOPHEN 10 MG/ML IV SOLN: 1000.0000 mg | Freq: Once | INTRAVENOUS | Status: DC | PRN

## 2021-04-15 SURGICAL SUPPLY — 65 items
ADH SKN CLS APL DERMABOND .7 (GAUZE/BANDAGES/DRESSINGS) ×1
AGENT HMST SPONGE THK3/8 (HEMOSTASIS)
BAG COUNTER SPONGE SURGICOUNT (BAG) ×2 IMPLANT
BAG SPNG CNTER NS LX DISP (BAG) ×1
BANDAGE ESMARK 6X9 LF (GAUZE/BANDAGES/DRESSINGS) IMPLANT
BLADE CLIPPER SURG (BLADE) ×2 IMPLANT
BNDG CMPR 9X6 STRL LF SNTH (GAUZE/BANDAGES/DRESSINGS)
BNDG ESMARK 6X9 LF (GAUZE/BANDAGES/DRESSINGS)
CANISTER SUCT 3000ML PPV (MISCELLANEOUS) ×2 IMPLANT
CATH EMB 3FR 80CM (CATHETERS) IMPLANT
CATH EMB 4FR 80CM (CATHETERS) ×1 IMPLANT
CATH EMB 5FR 80CM (CATHETERS) IMPLANT
CLIP VESOCCLUDE MED 24/CT (CLIP) ×2 IMPLANT
CLIP VESOCCLUDE SM WIDE 24/CT (CLIP) ×2 IMPLANT
COVER PROBE W GEL 5X96 (DRAPES) ×2 IMPLANT
CUFF TOURN SGL QUICK 18X4 (TOURNIQUET CUFF) IMPLANT
CUFF TOURN SGL QUICK 24 (TOURNIQUET CUFF)
CUFF TOURN SGL QUICK 34 (TOURNIQUET CUFF)
CUFF TOURN SGL QUICK 42 (TOURNIQUET CUFF) IMPLANT
CUFF TRNQT CYL 24X4X16.5-23 (TOURNIQUET CUFF) IMPLANT
CUFF TRNQT CYL 34X4.125X (TOURNIQUET CUFF) IMPLANT
DERMABOND ADVANCED (GAUZE/BANDAGES/DRESSINGS) ×1
DERMABOND ADVANCED .7 DNX12 (GAUZE/BANDAGES/DRESSINGS) ×1 IMPLANT
DRAIN CHANNEL 15F RND FF W/TCR (WOUND CARE) IMPLANT
DRAPE C-ARM 42X72 X-RAY (DRAPES) ×2 IMPLANT
DRAPE HALF SHEET 40X57 (DRAPES) IMPLANT
DRSG COVADERM 4X6 (GAUZE/BANDAGES/DRESSINGS) ×1 IMPLANT
DRSG COVADERM 4X8 (GAUZE/BANDAGES/DRESSINGS) ×1 IMPLANT
ELECT REM PT RETURN 9FT ADLT (ELECTROSURGICAL) ×2
ELECTRODE REM PT RTRN 9FT ADLT (ELECTROSURGICAL) ×1 IMPLANT
EVACUATOR SILICONE 100CC (DRAIN) IMPLANT
GLOVE SRG 8 PF TXTR STRL LF DI (GLOVE) ×1 IMPLANT
GLOVE SURG ENC MOIS LTX SZ7.5 (GLOVE) ×2 IMPLANT
GLOVE SURG UNDER POLY LF SZ8 (GLOVE) ×2
GOWN STRL REUS W/ TWL LRG LVL3 (GOWN DISPOSABLE) ×2 IMPLANT
GOWN STRL REUS W/ TWL XL LVL3 (GOWN DISPOSABLE) ×2 IMPLANT
GOWN STRL REUS W/TWL LRG LVL3 (GOWN DISPOSABLE) ×4
GOWN STRL REUS W/TWL XL LVL3 (GOWN DISPOSABLE) ×4
HEMOSTAT SPONGE AVITENE ULTRA (HEMOSTASIS) IMPLANT
KIT BASIN OR (CUSTOM PROCEDURE TRAY) ×2 IMPLANT
KIT TURNOVER KIT B (KITS) ×2 IMPLANT
LOOP VESSEL MINI RED (MISCELLANEOUS) ×1 IMPLANT
NS IRRIG 1000ML POUR BTL (IV SOLUTION) ×4 IMPLANT
PACK PERIPHERAL VASCULAR (CUSTOM PROCEDURE TRAY) ×2 IMPLANT
PAD ARMBOARD 7.5X6 YLW CONV (MISCELLANEOUS) ×4 IMPLANT
STAPLER VISISTAT 35W (STAPLE) ×1 IMPLANT
STOPCOCK 4 WAY LG BORE MALE ST (IV SETS) ×2 IMPLANT
SUT MNCRL AB 4-0 PS2 18 (SUTURE) ×4 IMPLANT
SUT PROLENE 5 0 C 1 24 (SUTURE) ×5 IMPLANT
SUT PROLENE 6 0 BV (SUTURE) ×2 IMPLANT
SUT PROLENE 7 0 BV 1 (SUTURE) IMPLANT
SUT SILK 2 0 PERMA HAND 18 BK (SUTURE) IMPLANT
SUT SILK 2 0 SH (SUTURE) ×2 IMPLANT
SUT SILK 3 0 (SUTURE)
SUT SILK 3-0 18XBRD TIE 12 (SUTURE) IMPLANT
SUT VIC AB 2-0 CT1 27 (SUTURE) ×4
SUT VIC AB 2-0 CT1 TAPERPNT 27 (SUTURE) ×2 IMPLANT
SUT VIC AB 3-0 SH 27 (SUTURE) ×4
SUT VIC AB 3-0 SH 27X BRD (SUTURE) ×2 IMPLANT
SYR 3ML LL SCALE MARK (SYRINGE) ×2 IMPLANT
TOWEL GREEN STERILE (TOWEL DISPOSABLE) ×2 IMPLANT
TRAY FOLEY MTR SLVR 16FR STAT (SET/KITS/TRAYS/PACK) ×2 IMPLANT
TUBING EXTENTION W/L.L. (IV SETS) ×2 IMPLANT
UNDERPAD 30X36 HEAVY ABSORB (UNDERPADS AND DIAPERS) ×2 IMPLANT
WATER STERILE IRR 1000ML POUR (IV SOLUTION) ×2 IMPLANT

## 2021-04-15 NOTE — Anesthesia Preprocedure Evaluation (Addendum)
Anesthesia Evaluation  Patient identified by MRN, date of birth, ID band Patient awake    Reviewed: Allergy & Precautions, NPO status , Patient's Chart, lab work & pertinent test results  Airway Mallampati: II  TM Distance: >3 FB Neck ROM: Full    Dental no notable dental hx.    Pulmonary neg pulmonary ROS, Current Smoker,    Pulmonary exam normal        Cardiovascular hypertension, Pt. on medications + Peripheral Vascular Disease (acute RLE ischemia )   Rhythm:Regular Rate:Normal     Neuro/Psych negative neurological ROS  negative psych ROS   GI/Hepatic negative GI ROS, Neg liver ROS,   Endo/Other  negative endocrine ROS  Renal/GU negative Renal ROS  negative genitourinary   Musculoskeletal  (+) Arthritis , Osteoarthritis,    Abdominal Normal abdominal exam  (+)   Peds  Hematology negative hematology ROS (+)   Anesthesia Other Findings Video interpreter used (vietnamese). All questions answered.  Patient reports NPO since 8 am. No family present with patient.  Reproductive/Obstetrics                           Anesthesia Physical Anesthesia Plan  ASA: 2 and emergent  Anesthesia Plan: General   Post-op Pain Management:    Induction: Intravenous  PONV Risk Score and Plan: 2 and Ondansetron, Dexamethasone, Midazolam and Treatment may vary due to age or medical condition  Airway Management Planned: Mask and Oral ETT  Additional Equipment: None  Intra-op Plan:   Post-operative Plan: Extubation in OR  Informed Consent: I have reviewed the patients History and Physical, chart, labs and discussed the procedure including the risks, benefits and alternatives for the proposed anesthesia with the patient or authorized representative who has indicated his/her understanding and acceptance.     Dental advisory given and Interpreter used for interveiw  Plan Discussed with:  CRNA  Anesthesia Plan Comments: (Lab Results      Component                Value               Date                      WBC                      9.3                 04/15/2021                HGB                      17.1 (H)            04/15/2021                HCT                      48.8                04/15/2021                MCV                      83.3                04/15/2021  PLT                      309                 04/15/2021           Lab Results      Component                Value               Date                      NA                       132 (L)             04/15/2021                K                        4.5                 04/15/2021                CO2                      22                  04/15/2021                GLUCOSE                  111 (H)             04/15/2021                BUN                      7 (L)               04/15/2021                CREATININE               0.78                04/15/2021                CALCIUM                  9.1                 04/15/2021                GFRNONAA                 >60                 04/15/2021          )       Anesthesia Quick Evaluation

## 2021-04-15 NOTE — ED Provider Notes (Signed)
Medical screening examination/treatment/procedure(s) were conducted as a shared visit with non-physician practitioner(s) and myself.  I personally evaluated the patient during the encounter.  Clinical Impression:   Final diagnoses:  Peripheral artery occlusion [facility])  Acute ischemic Leg  This patient is a 65 year old male, prior history of hypertension not on medications (blood pressure of 192/124 here).  Smokes 2 pack of cigarettes a day and takes a baby aspirin.  He presents with acute onset of right-sided leg pain which occurred around 9:00 this morning, now 10 hours later he feels like his right leg is numb.  On exam this patient has no pulses at the right foot, he has a cold right leg with diminished sensation, he has normal compartments which are very soft but no palpable or dopplerable pulse in the right foot at the dorsalis pedis or posterior tibialis.  Left foot has good capillary refill and normal pulses.  He has good femoral pulses at the right femoral artery.  Cardiac and pulmonary exams are unremarkable, the patient will be given pain medications, IV fluids, IV heparin, I discussed the case with Dr. Sherald Hess who will come see the patient in consultation and request the CT angiogram aortobifemoral with runoff.  I discussed this with the CT scan tech, they will perform the study immediately  This patient is critically ill with an acute ischemic leg.  Gust the case with Dr. Chestine Spore, Dr. Ophelia Charter and the patient will take the patient to the operating room.  This patient is critically ill, see physician assistant notes.  .Critical Care Performed by: Eber Hong, MD Authorized by: Eber Hong, MD   Critical care provider statement:    Critical care time (minutes):  45   Critical care time was exclusive of:  Separately billable procedures and treating other patients   Critical care was necessary to treat or prevent imminent or life-threatening deterioration of the following  conditions:  Circulatory failure   Critical care was time spent personally by me on the following activities:  Development of treatment plan with patient or surrogate, discussions with consultants, evaluation of patient's response to treatment, examination of patient, obtaining history from patient or surrogate, review of old charts, re-evaluation of patient's condition, pulse oximetry, ordering and review of radiographic studies, ordering and review of laboratory studies and ordering and performing treatments and interventions   Care discussed with: admitting provider        Eber Hong, MD 04/16/21 2217

## 2021-04-15 NOTE — Progress Notes (Signed)
Dr. Chestine Spore would like Heparin restarted at 11/6 0400. Orders modified to reflect change in MAR.

## 2021-04-15 NOTE — Transfer of Care (Signed)
Immediate Anesthesia Transfer of Care Note  Patient: Derek Rollins  Procedure(s) Performed: RIGHT LOWER EXTREMITY THROMBECTOMY (Right: Leg Lower) FOUR COMPARTMENT FASCIOTOMY (Right: Leg Lower)  Patient Location: PACU  Anesthesia Type:General  Level of Consciousness: awake and alert   Airway & Oxygen Therapy: Patient Spontanous Breathing and Patient connected to nasal cannula oxygen  Post-op Assessment: Report given to RN and Post -op Vital signs reviewed and stable  Post vital signs: Reviewed and stable  Last Vitals:  Vitals Value Taken Time  BP 154/97 04/15/21 2255  Temp    Pulse 102 04/15/21 2257  Resp 24 04/15/21 2257  SpO2 95 % 04/15/21 2257  Vitals shown include unvalidated device data.  Last Pain:  Vitals:   04/15/21 1607  TempSrc: Oral         Complications: No notable events documented.

## 2021-04-15 NOTE — Anesthesia Procedure Notes (Addendum)
Procedure Name: Intubation Date/Time: 04/15/2021 9:17 PM Performed by: Edmonia Caprio, CRNA Pre-anesthesia Checklist: Patient identified, Emergency Drugs available, Suction available, Patient being monitored and Timeout performed Patient Re-evaluated:Patient Re-evaluated prior to induction Oxygen Delivery Method: Circle system utilized Preoxygenation: Pre-oxygenation with 100% oxygen Induction Type: IV induction Ventilation: Mask ventilation without difficulty Laryngoscope Size: Miller and 2 Grade View: Grade I Tube type: Oral Tube size: 7.5 mm Number of attempts: 1 Airway Equipment and Method: Stylet Placement Confirmation: ETT inserted through vocal cords under direct vision, breath sounds checked- equal and bilateral and positive ETCO2 Secured at: 23 cm Tube secured with: Tape Dental Injury: Teeth and Oropharynx as per pre-operative assessment  Comments: Patient reported NPO >12 hours. Induced, with mask ventilation patient coughed up approx 10 ml clear fluid.  Immediately intubated patient w Mil 2, gr 2 view, cords appear clear.  7.5 ETT, suctioned ETT with minimal return.  Sats ok.  Teeth/lips unchanged.  OG inserted without difficulty and 125 ml light brown fluid returned.

## 2021-04-15 NOTE — H&P (Signed)
H&P    Reason for Consult:  Ischemic right leg Referring Physician: ED MRN #:  [id number]  History of Present Illness: This is a 65 y.o. male with history of tobacco abuse and hypertension that vascular surgery has been consulted for an ischemic right leg.  Apparently was in his normal state of health until 9 AM this morning when he developed severe pain from the right thigh down with numbness.  States he had no right leg problems prior to this including no history of claudication.  He does smoke 2 packs/day.  Denies taking any medications.  No family present.  Past Medical History:  Diagnosis Date   Arthritis    Hypertension     Past Surgical History:  Procedure Laterality Date   LUMBAR DISC SURGERY  1973   LUMBAR LAMINECTOMY/DECOMPRESSION MICRODISCECTOMY Left 09/24/2014   Procedure: CENTRAL DECOMPRESSION  LUMBAR LAMENECTOMYL 4-L5, FORAMENOTOMY L 4-5 ROOT FOR FORAMIAL STENOSIS, EXCISION OF SCAR TISSUE L 4-5;  Surgeon: Ranee Gosselin, MD;  Location: [facility];  Service: Orthopedics;  Laterality: Left;   TOTAL HIP ARTHROPLASTY Right 06/28/2014   Procedure: RIGHT TOTAL HIP ARTHROPLASTY;  Surgeon: Jacki Cones, MD;  Location: [facility];  Service: Orthopedics;  Laterality: Right;    Allergies  Allergen Reactions   Aspirin     Face and mouth numbness     Prior to Admission medications   Medication Sig Start Date End Date Taking? Authorizing Provider  acetaminophen (TYLENOL) 500 MG tablet Take 500 mg by mouth every 6 (six) hours as needed.    [provider]  amLODipine (NORVASC) 10 MG tablet Take 1 tablet (10 mg total) by mouth daily. Patient not taking: Reported on 04/05/2017 08/23/15   Julieanne Manson, MD  meloxicam [facility]) 7.5 MG tablet 1 tab by mouth daily with meal as needed for pain 08/26/17   Julieanne Manson, MD    Social History   Socioeconomic History   Marital status: Married    Spouse name: Separated   Number of children: 2   Years of education: 12    Highest education level: Not on file  Occupational History   Occupation: Unemployed     Comment: Working on achieving disability  Tobacco Use   Smoking status: Every Day    Packs/day: 0.50    Years: 24.00    Pack years: 12.00    Types: Cigarettes   Smokeless tobacco: Never   Tobacco comments:    Did call quit line with a student helping, but for some reason thought he was instructed to call 911.  Discussed that is a message only to call if having an emergency and needs to listen to entire message next time.  Interpreter stated she would help him  Substance and Sexual Activity   Alcohol use: Yes    Alcohol/week: 0.0 standard drinks    Comment: Occasional  on weekends--maybe 6 beers once a month  and karaoke with friends   Drug use: No   Sexual activity: Not on file  Other Topics Concern   Not on file  Social History Narrative   Born and raised in [place].   Is now separated from his wife and lives alone.   Has been unable to support his two sons, so they live with his brother in [place]   Fun: Can't do much now - sing karoke.   Social Determinants of Health   Financial Resource Strain: Not on file  Food Insecurity: Not on file  Transportation Needs: Not on file  Physical Activity: Not on file  Stress: Not on file  Social Connections: Not on file  Intimate Partner Violence: Not on file     No family history on file.  ROS: [x]  Positive   [ ]  Negative   [ ]  All sytems reviewed and are negative  Cardiovascular: []  chest pain/pressure []  palpitations []  SOB lying flat []  DOE []  pain in legs while walking []  pain in legs at rest []  pain in legs at night []  non-healing ulcers []  hx of DVT []  swelling in legs  Pulmonary: []  productive cough []  asthma/wheezing []  home O2  Neurologic: []  weakness in []  arms []  legs [x]  numbness in []  arms [x]  legs - right leg []  hx of CVA []  mini stroke [] difficulty speaking or slurred speech []  temporary loss of vision in one  eye []  dizziness  Hematologic: []  hx of cancer []  bleeding problems []  problems with blood clotting easily  Endocrine:   []  diabetes []  thyroid disease  GI []  vomiting blood []  blood in stool  GU: []  CKD/renal failure []  HD--[]  M/W/F or []  T/T/S []  burning with urination []  blood in urine  Psychiatric: []  anxiety []  depression  Musculoskeletal: []  arthritis []  joint pain  Integumentary: []  rashes []  ulcers  Constitutional: []  fever []  chills   Physical Examination  Vitals:   04/15/21 1607 04/15/21 1840  BP: (!) 192/124 (!) 172/103  Pulse: 100 93  Resp: 20 18  Temp: 98.1 F (36.7 C)   SpO2: 97% 97%   There is no height or weight on file to calculate BMI.  General:  NAD Gait: Not observed HENT: WNL, normocephalic Pulmonary: normal non-labored breathing Cardiac: regular, without  Murmurs, rubs or gallops Abdomen: soft, NT/ND Vascular Exam/Pulses: Palpable femoral pulses bilaterally Left popliteal and dorsalis pedis pulse palpable No palpable right popliteal or pedal pulses, right foot cold, able to wiggle toes Extremities: Right foot is cold but able to move foot Musculoskeletal: no muscle wasting or atrophy  Neurologic: A&O X 3; Appropriate Affect ; SENSATION: normal; MOTOR FUNCTION:  moving all extremities equally. Speech is fluent/normal   CBC    Component Value Date/Time   WBC 9.3 04/15/2021 1622   RBC 5.86 (H) 04/15/2021 1622   HGB 17.1 (H) 04/15/2021 1622   HGB 14.9 04/05/2017 0000   HCT 48.8 04/15/2021 1622   HCT 44.2 04/05/2017 0000   PLT 309 04/15/2021 1622   PLT 323 04/05/2017 0000   MCV 83.3 04/15/2021 1622   MCV 88 04/05/2017 0000   MCH 29.2 04/15/2021 1622   MCHC 35.0 04/15/2021 1622   RDW 12.8 04/15/2021 1622   RDW 14.5 04/05/2017 0000   LYMPHSABS 1.4 04/15/2021 1622   LYMPHSABS 3.3 (H) 04/05/2017 0000   MONOABS 0.5 04/15/2021 1622   EOSABS 0.0 04/15/2021 1622   EOSABS 0.2 04/05/2017 0000   BASOSABS 0.0 04/15/2021 1622    BASOSABS 0.0 04/05/2017 0000    BMET    Component Value Date/Time   NA 132 (L) 04/15/2021 1622   NA 142 04/05/2017 0000   K 4.5 04/15/2021 1622   CL 98 04/15/2021 1622   CO2 22 04/15/2021 1622   GLUCOSE 111 (H) 04/15/2021 1622   BUN 7 (L) 04/15/2021 1622   BUN 12 04/05/2017 0000   CREATININE 0.78 04/15/2021 1622   CALCIUM 9.1 04/15/2021 1622   GFRNONAA >60 04/15/2021 1622   GFRAA 114 04/05/2017 0000    COAGS: Lab Results  Component Value Date   INR 0.91 09/16/2014  INR 0.90 06/24/2014     Non-Invasive Vascular Imaging:    CTA abdomen pelvis lower extremity runoff reviewed and right common femoral appears occluded with thrombus in the profunda and occluded SFA that is diffusely diseased and there is no distal runoff in the tibials   ASSESSMENT/PLAN: This is a 65 y.o. male with history of hypertension and tobacco abuse who presents with acute ischemia of the right lower extremity.  After review of CTA he needs right common femoral endarterectomy with thrombectomy of the profunda and will attempt thrombectomy of the right lower extremity including the SFA although this appears chronically diseased and unclear if this is chronically occluded.  Also discussed possible fasciotomies.  All questions answered through interpreter.  Discussed will be at risk for limb loss even with intervention tonight.  [name], MD [facility] Office: [phone]  [name]

## 2021-04-15 NOTE — ED Triage Notes (Signed)
Pt from home with ems, c.o numbness and pain to his right leg ( from the hip down) for the past 6 hours. Denies injury or fall. Hx of right hip replacement.   HR 110 195/125 (hx of HTN) 99% room air RR 18 Cbg 122 Temp 97.7

## 2021-04-15 NOTE — ED Provider Notes (Signed)
[facility] EMERGENCY DEPARTMENT Provider Note   CSN: [id number] Arrival date & time: 04/15/21  1603     History Chief Complaint  Patient presents with   Leg Pain    Janice Seales Baray is a 65 y.o. male who presents to the ED with chief complaint of sudden onset bilateral leg numbness that is worse on the right side starting this morning at 9 AM.  Patient states that his legs are functioning normally yesterday, and he denies previous history of similar symptoms.  Patient states symptoms started as muscle cramping that prevented him from walking, and progressed into numbness throughout the day.  No alleviating or aggravating factors.  Denies headaches, vision changes, chest pain, palpitations, recent illness and fever. Patient denies history of stroke, heart disease, lung disease, DVT/PE.  Patient is a 2 pack/day smoker, and he was previously on hypertension medication but has not taken it in several years. He also had a right hip replacement in 2015.  The history is provided by the patient. A language interpreter was used.  Leg Pain Associated symptoms: no fever       Past Medical History:  Diagnosis Date   Arthritis    Hypertension     Patient Active Problem List   Diagnosis Date Noted   S/P lumbar laminectomy 02/10/2015   Spinal stenosis, lumbar region, with neurogenic claudication 09/24/2014   Arthralgia 08/12/2014   H/O total hip arthroplasty 06/28/2014   Essential hypertension 04/22/2014   Osteoarthritis of right hip 04/08/2014   Tobacco use disorder 04/08/2014    Past Surgical History:  Procedure Laterality Date   LUMBAR DISC SURGERY  1973   LUMBAR LAMINECTOMY/DECOMPRESSION MICRODISCECTOMY Left 09/24/2014   Procedure: CENTRAL DECOMPRESSION  LUMBAR LAMENECTOMYL 4-L5, FORAMENOTOMY L 4-5 ROOT FOR FORAMIAL STENOSIS, EXCISION OF SCAR TISSUE L 4-5;  Surgeon: Ranee Gosselin, MD;  Location: [facility];  Service: Orthopedics;  Laterality: Left;   TOTAL HIP ARTHROPLASTY  Right 06/28/2014   Procedure: RIGHT TOTAL HIP ARTHROPLASTY;  Surgeon: Jacki Cones, MD;  Location: [facility];  Service: Orthopedics;  Laterality: Right;       No family history on file.  Social History   Tobacco Use   Smoking status: Every Day    Packs/day: 0.50    Years: 24.00    Pack years: 12.00    Types: Cigarettes   Smokeless tobacco: Never   Tobacco comments:    Did call quit line with a student helping, but for some reason thought he was instructed to call 911.  Discussed that is a message only to call if having an emergency and needs to listen to entire message next time.  Interpreter stated she would help him  Substance Use Topics   Alcohol use: Yes    Alcohol/week: 0.0 standard drinks    Comment: Occasional  on weekends--maybe 6 beers once a month  and karaoke with friends   Drug use: No    Home Medications Prior to Admission medications   Medication Sig Start Date End Date Taking? Authorizing Provider  acetaminophen (TYLENOL) 500 MG tablet Take 500 mg by mouth every 6 (six) hours as needed.    [provider]  amLODipine (NORVASC) 10 MG tablet Take 1 tablet (10 mg total) by mouth daily. Patient not taking: Reported on 04/05/2017 08/23/15   Julieanne Manson, MD  meloxicam [facility]) 7.5 MG tablet 1 tab by mouth daily with meal as needed for pain 08/26/17   Julieanne Manson, MD    Allergies  Aspirin  Review of Systems   Review of Systems  Constitutional:  Negative for fever.  HENT: Negative.    Eyes: Negative.   Respiratory:  Negative for shortness of breath.   Cardiovascular: Negative.  Negative for chest pain.  Gastrointestinal:  Negative for abdominal pain and vomiting.  Endocrine: Negative.   Genitourinary: Negative.   Musculoskeletal: Negative.   Skin:  Negative for rash.  Neurological:  Positive for weakness and numbness. Negative for headaches.  Psychiatric/Behavioral: Negative.    All other systems reviewed and are negative.  Physical  Exam Updated Vital Signs BP (!) 172/103 (BP Location: Right Arm)   Pulse 93   Temp 98.1 F (36.7 C) (Oral)   Resp 18   SpO2 97%   Physical Exam Vitals and nursing note reviewed.  Constitutional:      General: He is not in acute distress.    Appearance: He is not ill-appearing.  HENT:     Head: Atraumatic.  Eyes:     Conjunctiva/sclera: Conjunctivae normal.  Cardiovascular:     Rate and Rhythm: Normal rate and regular rhythm.     Pulses:          Femoral pulses are 2+ on the right side and 2+ on the left side.      Dorsalis pedis pulses are 0 on the right side and 2+ on the left side.       Posterior tibial pulses are 0 on the right side and 2+ on the left side.     Heart sounds: Normal heart sounds. No murmur heard.    Comments: Absent pulses of the right DP and PT undetectable with Doppler. 2+ bounding pulses of the left DP  Pulmonary:     Effort: Pulmonary effort is normal. No respiratory distress.     Breath sounds: Normal breath sounds.  Abdominal:     General: Abdomen is flat. There is no distension.     Palpations: Abdomen is soft.     Tenderness: There is no abdominal tenderness.  Musculoskeletal:        General: Normal range of motion.     Cervical back: Normal range of motion.     Right lower leg: No edema.     Left lower leg: No edema.  Skin:    General: Skin is warm and dry.     Capillary Refill: Capillary refill takes 2 to 3 seconds.     Comments: Skin of bilateral lower extremities cool to touch.  Slightly increased cap refill time of the right distal extremity  Neurological:     Mental Status: He is alert.     Sensory: Sensory deficit present.     Motor: Weakness present. No tremor.     Comments: 5/5 bilateral grip strength.  5/5 hip strength on flexion and extension of the left side, 3/5 strength on hip flexion and extension on the right side.  Sensation absent for L4, L5 S1-S2.  Decreased sensation to touch on L2-L3    Psychiatric:        Mood and  Affect: Mood normal.    ED Results / Procedures / Treatments   Labs (all labs ordered are listed, but only abnormal results are displayed) Labs Reviewed  CBC WITH DIFFERENTIAL/PLATELET - Abnormal; Notable for the following components:      Result Value   RBC 5.86 (*)    Hemoglobin 17.1 (*)    All other components within normal limits  BASIC METABOLIC PANEL - Abnormal; Notable for the  following components:   Sodium 132 (*)    Glucose, Bld 111 (*)    BUN 7 (*)    All other components within normal limits  RESP PANEL BY RT-PCR (FLU A&B, COVID) [id number]  PROTIME-INR  TYPE AND SCREEN    EKG None  Radiology CT Lumbar Spine Wo Contrast  Result Date: 04/15/2021 CLINICAL DATA:  Numbness and pain in the right leg over the last 6 hours. EXAM: CT LUMBAR SPINE WITHOUT CONTRAST TECHNIQUE: Multidetector CT imaging of the lumbar spine was performed without intravenous contrast administration. Multiplanar CT image reconstructions were also generated. COMPARISON:  Multiple exams, including 09/16/2014 FINDINGS: Segmentation: Mildly transitional L5 vertebra with broad transverse processes which pseudoarticulates with the sacrum. Alignment: No vertebral subluxation is observed. Vertebrae: Loss of disc height at L4-5 with associated degenerative endplate findings and nitrogen gas phenomenon in disc. There is also nitrogen gas phenomenon in the L3-4 disc. Speckled calcifications along portions of the annulus fibrosus at each level between L3 and L5, also with some speckled calcifications along the interspinous ligaments at L2-3 and L3-4. No lumbar spine fracture or acute bony findings. We partially include a right total hip prosthesis. Prior bilateral laminectomy and removal of the spinous process at L4. Paraspinal and other soft tissues: Atherosclerosis is present, including aortoiliac atherosclerotic disease. Disc levels: L1-2: Unremarkable. L2-3: No impingement.  Mild disc bulge. L3-4: Moderate central  narrowing of the thecal sac and mild left foraminal stenosis due to disc bulge and mild left facet arthropathy. L4-5: Moderate right and mild left foraminal stenosis due to disc bulge, intervertebral spurring, and facet arthropathy. Left paracentral disc protrusion, posterior decompression at this level. Suspected moderate right subarticular lateral recess stenosis due to degenerative disc disease and ligamentum flavum on the right for example on image 83 series 3 and image 49 series 8. L5-S1: No impingement. 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 IMPRESSION: 1. Lumbar spondylosis and degenerative disc disease, causing moderate impingement at L3-4 and L4-5. The dominant right-sided findings are at L4-5 where there is moderate right foraminal stenosis and moderate right subarticular lateral recess stenosis. 2. 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 3.  Aortic Atherosclerosis (ICD10-I70.0). Electronically Signed   By: Gaylyn Rong M.D.   On: 04/15/2021 17:18    Procedures .Critical Care Performed by: Janell Quiet, PA-C Authorized by: Janell Quiet, PA-C   Critical care provider statement:    Critical care start time:  04/15/2021 7:06 PM   Critical care end time:  04/15/2021 7:36 PM   Critical care was necessary to treat or prevent imminent or life-threatening deterioration of the following conditions:  Circulatory failure   Critical care was time spent personally by me on the following activities:  Examination of patient, ordering and performing treatments and interventions and ordering and review of laboratory studies   I assumed direction of critical care for this patient from another provider in my specialty: no     Care discussed with: admitting provider     Medications Ordered in  ED Medications  HYDROmorphone (DILAUDID) injection 1 mg (has no administration in time range)  hydrALAZINE (APRESOLINE) injection 10 mg (has no administration in time range)    ED Course  I have reviewed the triage vital signs and the nursing notes.  Pertinent labs & imaging results that were available during  my care of the patient were reviewed by me and considered in my medical decision making (see chart for details).    MDM Rules/Calculators/A&P                         This is a 65 year old male who presents with chief complaint of bilateral leg pain progressing into numbness with a sudden onset at 9 AM this morning.  Pulses were absent and undetectable by Doppler on the right dorsalis pedis and right posterior tibialis. Patient is a 2 pack/day smoker. This in combination with patient's symptoms presents high concern for acute ischemic leg.  All labs and imaging were independently reviewed and interpreted by me.  Labs ordered in ED:  BMP and CBC unremarkable EKG normal sinus rhythm  Imaging ordered in ED: CT lumbar spine showed lumbar spondylosis and generative degenerative disc disease but does not account for his current symptoms  Dr. Eber Hong consulted with Dr. Sherald Hess in vascular surgery who recommended stat CTA of the aortobifemoral with contrast and immediate IV heparin and pharmacy recommended dose.  Patient has limb threatening function and was admitted to vascular surgery service for immediate intervention.  Final Clinical Impression(s) / ED Diagnoses Final diagnoses:  Peripheral artery occlusion [facility])  Ischemic leg    Rx / DC Orders ED Discharge Orders     None        Delight Ovens 04/15/21 2121    Eber Hong, MD 04/16/21 2218

## 2021-04-15 NOTE — Op Note (Signed)
Date: April 15, 2021  Preoperative diagnosis: Acute right lower extremity limb ischemia  Postoperative diagnosis: Same  Procedure: 1.  Right lower extremity thrombectomy via transfemoral approach including profunda and SFA 2.  Right lower extremity 4 compartment fasciotomies  Surgeon: Dr. [name], MD  Assistant: Wendi Maya, PA  Indications: Patient is a 65 year old male who presented to the ED with acute onset pain and weakness in the right lower extremity that started around 9 AM.  He had no pulses or doppler signals in the right foot.  Vascular surgery was consulted and CTA showed occlusion of the distal common femoral artery with thrombus in the proximal SFA and profunda.  He presents for right lower extremity thrombectomy and fasciotomies after risk benefits discussed.  An assistant was needed for exposure and to expedite the case.    Findings: The right common femoral artery was actually soft on the anterior wall.  I got out the SFA and profunda through her groin incision and then a transverse arteriotomy was made in the common femoral artery.  I then passed a #4 Fogarty down the profunda with no resistance and retrieved multiple plugs of acute thrombus.  I then passed a #4 Fogarty down the SFA all the way into the foot with no resistance and retrieved additional plugs of fresh thrombus.  The arteriotomy was closed with 5-0 Prolene in interrupted fashion.  He had triphasic signal in the SFA and profunda.  He had a palpable dorsalis pedis and posterior tibial pulse in the foot.  4 compartment fasciotomies were then performed of the right lower extremity and the muscle appeared viable.  Anesthesia: General  Details: Patient was taken to the operating room after informed consent was obtained.  Placed on the operative table in supine position.  General endotracheal anesthesia was induced.  The right groin and right leg were then prepped and draped in usual sterile fashion.   Timeout was performed.  Antibiotics were given.  Initially made a vertical groin incision in the right groin over the femoral pulse.  Dissected down with Bovie cautery and opened the subcutaneous tissue and the femoral sheath was opened longitudinally.  Retractors were used for added visualization.  The distal external iliac as well as SFA and profunda were all dissected out and controlled with Vesseloops.  Patient was given 100 units/kg IV heparin.  We then controlled SFA and profunda with Vesseloops and the distal external iliac was controlled with a Henley clamp.  Transverse arteriotomy was made with 11 blade scalpel extended Potts scissors in the right common femoral artery where the artery was soft anteriorly just before the bifurcation of the common femoral.  I then was able to directly visualize thrombus that was removed in the distal common femoral.  I then passed a #4 Fogarty down the profunda with no resistance and got multiple plugs of thrombus.  We then did the same down the SFA and got the #4 Fogarty all the way into the foot and got additional plugs of thrombus.  We had good backbleeding from both vessels.  I made at least one additional clean pass in each vessel.  The arteriotomy was irrigated out with heparinized saline.  We closed the arteriotomy with 5-0 Prolene in interrupted fashion.  We had triphasic Doppler signal in the SFA and profunda at completion.  There was a palpable pulse in the right foot.  At that point time we elected not to reverse the heparin.  We then made a longitudinal incisions on  the medial and lateral right calf.  Through the lateral incision we then dissected down with Bovie cautery and opened the subcutaneous tissue found the septum and opened the anterior and lateral muscle compartments with Metzenbaum scissors and all the muscle appeared viable.  We then did the same on the medial calf through the longitudinal incision dissected down with Bovie cautery until we identified  the superficial posterior fascia that was then opened with Metzenbaum scissors and then we entered the deep compartment as well.  Again all the muscle was viable.  Both fasciotomies were irrigated out and the skin was closed with staples.  The groin was then closed in multiple layers of 2-0 Vicryl, 3-0 Vicryl, 4-0 Monocryl and Dermabond.  Taken to recovery in stable condition.  Complication: None  Condition: Stable  [name], MD [facility] Office: [phone]   [name]

## 2021-04-15 NOTE — ED Provider Notes (Signed)
Emergency Medicine Provider Triage Evaluation Note  Logen Heintzelman Gent , a 65 y.o. male  was evaluated in triage.  Pt complains of new onset bilateral leg pain, numbness and tingling that started this morning. He has a history of prior back surgery and hip surgery in 2015. No recent fever, diarrhea, or other illness. Cramping in bil calves with movement.  Review of Systems  Positive: Leg pain, cramping, numbness, tingling Negative: Fever, diarrhea, facial droop, confusion  Physical Exam  BP (!) 192/124   Pulse 100   Temp 98.1 F (36.7 C) (Oral)   Resp 20   SpO2 97%  Gen:   Awake, no distress   Resp:  Normal effort  MSK:   Moves extremities without difficulty  Other:  Intact strength bil LE, with cramping of calf muscles when patient instructed to move the legs -- subjective sensation change in BIL LE  Medical Decision Making  Medically screening exam initiated at 4:22 PM.  Appropriate orders placed.  Amias T Popp was informed that the remainder of the evaluation will be completed by another provider, this initial triage assessment does not replace that evaluation, and the importance of remaining in the ED until their evaluation is complete.  Leg numbness, pain, BIL   Olene Floss, PA-C 04/15/21 1624    Rolan Bucco, MD 04/15/21 1801

## 2021-04-15 NOTE — Progress Notes (Signed)
ANTICOAGULATION CONSULT NOTE - Initial Consult  Pharmacy Consult for Heparin Indication:  acute ischemia of the right lower extremity  Allergies  Allergen Reactions   Aspirin     Face and mouth numbness     Patient Measurements:   Heparin Dosing Weight: 50.8 kg  Vital Signs: Temp: 98.1 F (36.7 C) (11/05 1607) Temp Source: Oral (11/05 1607) BP: 157/104 (11/05 1930) Pulse Rate: 93 (11/05 1930)  Labs: Recent Labs    04/15/21 1622  HGB 17.1*  HCT 48.8  PLT 309  CREATININE 0.78    CrCl cannot be calculated (Unknown ideal weight.).   Medical History: Past Medical History:  Diagnosis Date   Arthritis    Hypertension     Medications:  (Not in a hospital admission)  Scheduled:   hydrALAZINE  10 mg Intravenous Once    HYDROmorphone (DILAUDID) injection  1 mg Intravenous Once   Infusions:  PRN:   Assessment: 65 yom with a history of tobacco abuse and hypertension . Patient is presenting with sudden onset bilateral leg numbness that is worse on the right side. Heparin per pharmacy consult placed for  acute ischemia of the right lower extremity .  Vascular surgery following and planning for procedure.  Patient is on not on anticoagulation prior to arrival.  Hgb 17.1; plt 309  Goal of Therapy:  Heparin level 0.3-0.7 units/ml Monitor platelets by anticoagulation protocol: Yes   Plan:  Give 3000 units bolus x 1 Start heparin infusion at 900 units/hr Check anti-Xa level in 6-8 hours and daily while on heparin Continue to monitor H&H and platelets  Delmar Landau, PharmD, BCPS 04/15/2021 7:43 PM ED Clinical Pharmacist -  [phone]

## 2021-04-16 ENCOUNTER — Inpatient Hospital Stay (HOSPITAL_COMMUNITY): Payer: Medicare Other

## 2021-04-16 DIAGNOSIS — I998 Other disorder of circulatory system: Secondary | ICD-10-CM | POA: Diagnosis not present

## 2021-04-16 DIAGNOSIS — I10 Essential (primary) hypertension: Secondary | ICD-10-CM

## 2021-04-16 DIAGNOSIS — Z9889 Other specified postprocedural states: Secondary | ICD-10-CM

## 2021-04-16 LAB — CBC
HCT: 42.6 % (ref 39.0–52.0)
Hemoglobin: 14.7 g/dL (ref 13.0–17.0)
MCH: 29.2 pg (ref 26.0–34.0)
MCHC: 34.5 g/dL (ref 30.0–36.0)
MCV: 84.5 fL (ref 80.0–100.0)
Platelets: 254 10*3/uL (ref 150–400)
RBC: 5.04 MIL/uL (ref 4.22–5.81)
RDW: 12.9 % (ref 11.5–15.5)
WBC: 16.3 10*3/uL — ABNORMAL HIGH (ref 4.0–10.5)
nRBC: 0 % (ref 0.0–0.2)

## 2021-04-16 LAB — ECHOCARDIOGRAM COMPLETE
AR max vel: 2.29 cm2
AV Area VTI: 1.64 cm2
AV Area mean vel: 2.31 cm2
AV Mean grad: 2 mmHg
AV Peak grad: 4.6 mmHg
Ao pk vel: 1.07 m/s
Area-P 1/2: 3.48 cm2
Calc EF: 43.4 %
Height: 61 in
S' Lateral: 3.1 cm
Single Plane A2C EF: 47.3 %
Single Plane A4C EF: 42 %
Weight: 1784.84 oz

## 2021-04-16 LAB — LIPID PANEL
Cholesterol: 150 mg/dL (ref 0–200)
HDL: 73 mg/dL (ref >40)
LDL Cholesterol: 57 mg/dL (ref 0–99)
Total CHOL/HDL Ratio: 2.1 RATIO
Triglycerides: 102 mg/dL (ref <150)
VLDL: 20 mg/dL (ref 0–40)

## 2021-04-16 LAB — HEPARIN LEVEL (UNFRACTIONATED)
Heparin Unfractionated: 0.39 IU/mL (ref 0.30–0.70)
Heparin Unfractionated: 0.48 IU/mL (ref 0.30–0.70)

## 2021-04-16 LAB — BASIC METABOLIC PANEL
Anion gap: 14 (ref 5–15)
BUN: 10 mg/dL (ref 8–23)
CO2: 17 mmol/L — ABNORMAL LOW (ref 22–32)
Calcium: 8.2 mg/dL — ABNORMAL LOW (ref 8.9–10.3)
Chloride: 100 mmol/L (ref 98–111)
Creatinine, Ser: 0.8 mg/dL (ref 0.61–1.24)
GFR, Estimated: >60 mL/min (ref >60)
Glucose, Bld: 121 mg/dL — ABNORMAL HIGH (ref 70–99)
Potassium: 4.5 mmol/L (ref 3.5–5.1)
Sodium: 131 mmol/L — ABNORMAL LOW (ref 135–145)

## 2021-04-16 MED ORDER — PERFLUTREN LIPID MICROSPHERE
1.0000 mL | INTRAVENOUS | Status: AC | PRN
  Administered 2021-04-16: 2 mL via INTRAVENOUS
  Filled 2021-04-16: qty 10

## 2021-04-16 NOTE — Evaluation (Signed)
Occupational Therapy Evaluation Patient Details Name: Derek Rollins MRN: [id number] DOB: Jul 05, 1955 Today's Date: 04/16/2021   History of Present Illness Pt is a 65 y.o. male admitted 11/5 for ischemic RLE. He underwent RLE thrombectomy and fasciotomies. 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 Overall pt with excellent tolerance and performance with OT eval, requiring only Sup for functional self care transfers, and intermittent A for LB dressing d/t mild reports of comfort, but able to return modification of task completion to increase to Sup level of performance.  OT will continue to follow acutely, but at this time pt presents without post acute needs at this time aside from possibly a shower chair to maximize safety.      Recommendations for follow up therapy are one component of a multi-disciplinary discharge planning process, led by the attending physician.  Recommendations may be updated based on patient status, additional functional criteria and insurance authorization.   Follow Up Recommendations  No OT follow up    Assistance Recommended at Discharge Intermittent Supervision/Assistance  Functional Status Assessment  Patient has had a recent decline in their functional status and demonstrates the ability to make significant improvements in function in a reasonable and predictable amount of time.  Equipment Recommendations  Tub/shower seat    Recommendations for Other Services       Precautions / Restrictions Precautions Precautions: None      Mobility Bed Mobility Overal bed mobility: Independent                  Transfers Overall transfer level: Needs assistance Equipment used: Rolling walker (2  wheels) Transfers: Sit to/from Stand;Stand Pivot Transfers Sit to Stand: Supervision Stand pivot transfers: Supervision         General transfer comment: sup and cues provided for safety with management of RW      Balance Overall balance assessment: Mild deficits observed, not formally tested                                         ADL either performed or assessed with clinical judgement   ADL                                               Vision Baseline Vision/History: 0 No visual deficits Vision Assessment?: No apparent visual deficits     Perception     Praxis      Pertinent Vitals/Pain Pain Assessment: 0-10 Pain Score: 2      Hand Dominance Right   Extremity/Trunk Assessment Upper Extremity Assessment Upper Extremity Assessment: Overall WFL for tasks assessed   Lower Extremity Assessment Lower Extremity Assessment: RLE deficits/detail RLE Deficits / Details: s/p thrombectomy and fasciotomies   Cervical / Trunk Assessment Cervical / Trunk Assessment: Normal   Communication Communication Communication: Prefers language other than English   Cognition Arousal/Alertness: Awake/alert Behavior During Therapy: WFL for tasks assessed/performed Overall Cognitive Status: Within Functional Limits for tasks assessed  General Comments  VSS on RA, wrapping to R LE distally intact    Exercises     Shoulder Instructions      Home Living Family/patient expects to be discharged to:: Private residence Living Arrangements: Spouse/significant other (Brother) Available Help at Discharge: Family;Available PRN/intermittently Type of Home: House Home Access: Stairs to enter [employer] of Steps: 3 Entrance Stairs-Rails: Can reach both Home Layout: One level     Bathroom Shower/Tub: Chief Strategy Officer: Standard     Home Equipment: Cane - single  point;Shower seat          Prior Functioning/Environment Prior Level of Function : Independent/Modified Independent                        OT Problem List: Decreased activity tolerance;Impaired balance (sitting and/or standing);Decreased knowledge of use of DME or AE      OT Treatment/Interventions:      OT Goals(Current goals can be found in the care plan section) Acute Rehab OT Goals Patient Stated Goal: to go home OT Goal Formulation: With patient Time For Goal Achievement: 04/30/21 Potential to Achieve Goals: Good ADL Goals Additional ADL Goal #1: pt will be mod indep with completion of basic ADLs with use of LRAD Additional ADL Goal #2: pt will be mod indep with all self care transfers with use of LARD  OT Frequency:     Barriers to D/C:            Co-evaluation              AM-PAC OT "6 Clicks" Daily Activity     Outcome Measure Help from another person eating meals?: None Help from another person taking care of personal grooming?: A Little Help from another person toileting, which includes using toliet, bedpan, or urinal?: A Little Help from another person bathing (including washing, rinsing, drying)?: A Little Help from another person to put on and taking off regular upper body clothing?: A Little Help from another person to put on and taking off regular lower body clothing?: A Little 6 Click Score: 19   End of Session Equipment Utilized During Treatment: Rolling walker (2 wheels) Nurse Communication: Mobility status  Activity Tolerance: Patient tolerated treatment well Patient left: in chair;with chair alarm set;with call bell/phone within reach  OT Visit Diagnosis: Unsteadiness on feet (R26.81)                Time: 0865-7846 OT Time Calculation (min): 31 min Charges:  OT General Charges $OT Visit: 1 Visit OT Evaluation $OT Eval Low Complexity: 1 Low OT Treatments $Self Care/Home Management : 8-22 mins  Derek Rollins OTR/L acute rehab  services Office: [phone]   Wilhemena Durie 04/16/2021, 09:49 AM

## 2021-04-16 NOTE — Progress Notes (Signed)
  Echocardiogram 2D Echocardiogram has been performed.  Roosvelt Maser F 04/16/2021, 4:21 PM

## 2021-04-16 NOTE — Progress Notes (Addendum)
   VASCULAR SURGERY ASSESSMENT & PLAN:   POD 1 PAD in setting of acute right leg ischemia s/p right lower extremity thrombectomy via transfemoral approach including profunda and SFA, right lower extremity 4 compartment fasciotomies. LE well perfused. Sensorimotor intact. Continue IV heparin. VSS. Afebrile. Labs stable.  OOB with assistance today. Statin therapy intiated. Counsel regarding smoking cessation.   SUBJECTIVE:   Interview and exam performed with video interpretive services. Incisional soreness, but RLE pain greatly improved  PHYSICAL EXAM:   Vitals:   04/16/21 0354 04/16/21 0400 04/16/21 0500 04/16/21 0600  BP: 130/82 (!) 120/100 130/83 118/88  Pulse: 89 88 89 95  Resp: 13 17 12 20   Temp: 98.8 F (37.1 C)     TempSrc: Oral     SpO2: 93% 94% 97% 98%  Weight:      Height:       General appearance: Awake, alert in no apparent distress Cardiac: Heart rate and rhythm are regular Respirations: Nonlabored Incisions: Right groin and lower leg incisions are all well approximated without bleeding or hematoma. Extremities: Both feet are warm with intact sensation and motor function.  Right calf is soft Pulse/Doppler exam: Palpable right PT and left DP pulses   LABS:   Lab Results  Component Value Date   WBC 16.3 (H) 04/16/2021   HGB 14.7 04/16/2021   HCT 42.6 04/16/2021   MCV 84.5 04/16/2021   PLT 254 04/16/2021   Lab Results  Component Value Date   CREATININE 0.80 04/16/2021   Lab Results  Component Value Date   INR 1.0 04/15/2021   CBG (last 3)  No results for input(s): GLUCAP in the last 72 hours.  PROBLEM LIST:    Active Problems:   Status post surgery   Ischemia of right lower extremity   CURRENT MEDS:    amLODipine  10 mg Oral Daily   docusate sodium  100 mg Oral Daily   pantoprazole  40 mg Oral Daily   rosuvastatin  20 mg Oral Daily   13/10/2020, Milinda Antis  Office: [phone] 04/16/2021   I have seen and evaluated the patient. I agree  with the PA note as documented above.  Postop day 1 status post right lower extremity thrombectomy with 4 compartment fasciotomies.  He has a small hematoma in the groin that we will watch.  Palpable DP PT pulses in the right foot and symptoms improved.  Calf soft.  Will order echocardiogram for thromboembolic work-up.  Continue heparin.  Hemoglobin 14.7  13/11/2020, MD [facility] Office: [phone]

## 2021-04-16 NOTE — Progress Notes (Signed)
ANTICOAGULATION CONSULT NOTE  Pharmacy Consult for Heparin Indication:  acute ischemia of the right lower extremity  Allergies  Allergen Reactions   Aspirin     Face and mouth numbness     Patient Measurements: Height: 5\' 1"  (154.9 cm) Weight: 50.6 kg (111 lb 8.8 oz) IBW/kg (Calculated) : 52.3 Heparin Dosing Weight: 50.8 kg  Vital Signs: Temp: 98.6 F (37 C) (11/06 1958) Temp Source: Oral (11/06 1958) BP: 104/70 (11/06 1958) Pulse Rate: 83 (11/06 1958)  Labs: Recent Labs    04/15/21 1622 04/15/21 1931 04/16/21 0259 04/16/21 1140 04/16/21 2027  HGB 17.1*  --  14.7  --   --   HCT 48.8  --  42.6  --   --   PLT 309  --  254  --   --   LABPROT  --  12.8  --   --   --   INR  --  1.0  --   --   --   HEPARINUNFRC  --   --   --  0.39 0.48  CREATININE 0.78  --  0.80  --   --      Estimated Creatinine Clearance: 66.8 mL/min (by C-G formula based on SCr of 0.8 mg/dL).   Medical History: Past Medical History:  Diagnosis Date   Arthritis    Hypertension     Medications:  Medications Prior to Admission  Medication Sig Dispense Refill Last Dose   amLODipine (NORVASC) 10 MG tablet Take 1 tablet (10 mg total) by mouth daily. (Patient not taking: No sig reported) 30 tablet 11 Not Taking   meloxicam (MOBIC) 7.5 MG tablet 1 tab by mouth daily with meal as needed for pain (Patient not taking: Reported on 04/16/2021) 30 tablet 6 Not Taking    Scheduled:   amLODipine  10 mg Oral Daily   docusate sodium  100 mg Oral Daily   pantoprazole  40 mg Oral Daily   rosuvastatin  20 mg Oral Daily   Infusions:   sodium chloride     heparin 900 Units/hr (04/16/21 1500)   magnesium sulfate bolus IVPB     Assessment: 65 yom with a history of tobacco abuse and HTN, presenting with sudden onset bilateral leg numbness that is worse on the right side. Heparin per pharmacy consult placed for acute ischemia of the right lower extremity; no anticoagulation PTA. Pt is s/p thrombectomy and  4-compartment fasciotomies 11/5.  Heparin level of at 0.48 therapeutic after 3000 unit bolus and 900 unit/hr. CBC remains stable. Small hematoma noted.  Goal of Therapy:  Heparin level 0.3-0.7 units/ml Monitor platelets by anticoagulation protocol: Yes   Plan:  Continue heparin infusion at 900 units/hr Check HL with AM labs - consider more frequent re-checks if AM heparin level trending upwards CBC, HL daily Continue to monitor H&H and platelets  13/5, PharmD, BCPS 04/16/2021 9:38 PM ED Clinical Pharmacist -  [phone]  Please check [email or web address] for unit-specific pharmacy phone numbers.

## 2021-04-16 NOTE — Progress Notes (Signed)
ANTICOAGULATION CONSULT NOTE  Pharmacy Consult for Heparin Indication:  acute ischemia of the right lower extremity  Allergies  Allergen Reactions   Aspirin     Face and mouth numbness     Patient Measurements: Height: 5\' 1"  (154.9 cm) Weight: 50.6 kg (111 lb 8.8 oz) IBW/kg (Calculated) : 52.3 Heparin Dosing Weight: 50.8 kg  Vital Signs: Temp: 98.3 F (36.8 C) (11/06 1053) Temp Source: Oral (11/06 1053) BP: 109/77 (11/06 1100) Pulse Rate: 77 (11/06 1100)  Labs: Recent Labs    04/15/21 1622 04/15/21 1931 04/16/21 0259 04/16/21 1140  HGB 17.1*  --  14.7  --   HCT 48.8  --  42.6  --   PLT 309  --  254  --   LABPROT  --  12.8  --   --   INR  --  1.0  --   --   HEPARINUNFRC  --   --   --  0.39  CREATININE 0.78  --  0.80  --      Estimated Creatinine Clearance: 66.8 mL/min (by C-G formula based on SCr of 0.8 mg/dL).   Medical History: Past Medical History:  Diagnosis Date   Arthritis    Hypertension     Medications:  Medications Prior to Admission  Medication Sig Dispense Refill Last Dose   acetaminophen (TYLENOL) 500 MG tablet Take 500 mg by mouth every 6 (six) hours as needed.      amLODipine (NORVASC) 10 MG tablet Take 1 tablet (10 mg total) by mouth daily. (Patient not taking: Reported on 04/05/2017) 30 tablet 11    meloxicam (MOBIC) 7.5 MG tablet 1 tab by mouth daily with meal as needed for pain 30 tablet 6     Scheduled:   amLODipine  10 mg Oral Daily   docusate sodium  100 mg Oral Daily   pantoprazole  40 mg Oral Daily   rosuvastatin  20 mg Oral Daily   Infusions:   sodium chloride     heparin 900 Units/hr (04/16/21 0419)   magnesium sulfate bolus IVPB     Assessment: 65 yom with a history of tobacco abuse and HTN, presenting with sudden onset bilateral leg numbness that is worse on the right side. Heparin per pharmacy consult placed for acute ischemia of the right lower extremity; no anticoagulation PTA. Now s/p thrombectomy and 4-compartment  fasciotomies 11/5.  Heparin level of 0.39 therapeutic after 3000 unit bolus and 900 unit/hr. CBC remains stable. No signs/symptoms of bleeding or infusion problems noted.  Goal of Therapy:  Heparin level 0.3-0.7 units/ml Monitor platelets by anticoagulation protocol: Yes   Plan:  Continue heparin infusion at 900 units/hr Confirmatory 6 hour HL CBC, HL daily Continue to monitor H&H and platelets  13/5, PharmD PGY1 Pharmacy Resident 04/16/2021  12:15 PM  Please check [email or web address] for unit-specific pharmacy phone numbers.

## 2021-04-16 NOTE — Evaluation (Signed)
Physical Therapy Evaluation Patient Details Name: Derek Rollins MRN: [id number] DOB: 09-24-1955 Today's Date: 04/16/2021  History of Present Illness  Pt is a 65 y.o. male admitted 11/5 for ischemic RLE. He underwent RLE thrombectomy and fasciotomies. 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 PTA pt lived at home with family, independent. On eval, he required supervision transfers and ambulation 200' with RW.  Pt will benefit from skilled PT to increase their independence and safety with mobility to allow discharge home. PT to follow acutely. No follow up services indicated.          Recommendations for follow up therapy are one component of a multi-disciplinary discharge planning process, led by the attending physician.  Recommendations may be updated based on patient status, additional functional criteria and insurance authorization.  Follow Up Recommendations No PT follow up    Assistance Recommended at Discharge Intermittent Supervision/Assistance  Functional Status Assessment Patient has had a recent decline in their functional status and demonstrates the ability to make significant improvements in function in a reasonable and predictable amount of time.  Equipment Recommendations  Rolling walker (2 wheels)    Recommendations for Other Services       Precautions / Restrictions Precautions Precautions: None      Mobility  Bed Mobility Overal bed mobility: Modified Independent                  Transfers Overall transfer level: Needs assistance Equipment used: Rolling walker (2 wheels) Transfers: Sit to/from Stand;Stand Pivot Transfers Sit to Stand: Supervision Stand pivot transfers: Supervision         General transfer comment: supervision for safety    Ambulation/Gait Ambulation/Gait assistance: Supervision Gait Distance (Feet): 200 Feet Assistive device: Rolling walker (2  wheels) Gait Pattern/deviations: Step-through pattern;Decreased stride length Gait velocity: decreased Gait velocity interpretation: 1.31 - 2.62 ft/sec, indicative of limited community ambulator General Gait Details: steady gait with RW  Stairs            Wheelchair Mobility    Modified Rankin (Stroke Patients Only)       Balance Overall balance assessment: Mild deficits observed, not formally tested                                           Pertinent Vitals/Pain Pain Assessment: No/denies pain    Home Living Family/patient expects to be discharged to:: Private residence Living Arrangements: Spouse/significant other Available Help at Discharge: Family;Available 24 hours/day Type of Home: House Home Access: Stairs to enter Entrance Stairs-Rails: Can reach both Entrance Stairs-Number of Steps: 3   Home Layout: One level Home Equipment: Cane - single point;Shower seat      Prior Function Prior Level of Function : Independent/Modified Independent                     Hand Dominance        Extremity/Trunk Assessment   Upper Extremity Assessment Upper Extremity Assessment: Overall WFL for tasks assessed    Lower Extremity Assessment Lower Extremity Assessment: RLE deficits/detail RLE Deficits / Details: s/p thrombectomy and fasciotomies    Cervical / Trunk Assessment Cervical / Trunk Assessment: Normal  Communication   Communication: Prefers language other than English ([place]  Cognition Arousal/Alertness: Awake/alert Behavior During Therapy: WFL for tasks assessed/performed Overall  Cognitive Status: Within Functional Limits for tasks assessed                                          General Comments General comments (skin integrity, edema, etc.): VSS on RA    Exercises     Assessment/Plan    PT Assessment Patient needs continued PT services  PT Problem List Decreased mobility;Decreased activity  tolerance;Decreased balance       PT Treatment Interventions DME instruction;Therapeutic activities;Gait training;Therapeutic exercise;Stair training;Functional mobility training;Balance training;Patient/family education    PT Goals (Current goals can be found in the Care Plan section)  Acute Rehab PT Goals Patient Stated Goal: home PT Goal Formulation: With patient Time For Goal Achievement: 04/30/21 Potential to Achieve Goals: Good    Frequency     Barriers to discharge        Co-evaluation               AM-PAC PT "6 Clicks" Mobility  Outcome Measure Help needed turning from your back to your side while in a flat bed without using bedrails?: None Help needed moving from lying on your back to sitting on the side of a flat bed without using bedrails?: None Help needed moving to and from a bed to a chair (including a wheelchair)?: A Little Help needed standing up from a chair using your arms (e.g., wheelchair or bedside chair)?: A Little Help needed to walk in hospital room?: A Little Help needed climbing 3-5 steps with a railing? : A Little 6 Click Score: 20    End of Session Equipment Utilized During Treatment: Gait belt Activity Tolerance: Patient tolerated treatment well Patient left: in bed;with call bell/phone within reach Nurse Communication: Mobility status PT Visit Diagnosis: Difficulty in walking, not elsewhere classified (R26.2)    Time: 4782-9562 PT Time Calculation (min) (ACUTE ONLY): 11 min   Charges:   PT Evaluation $PT Eval Low Complexity: 1 Low          Aida Raider, PT  Office # [phone] Pager [phone]   Ilda Foil 04/16/2021, 1:23 PM

## 2021-04-16 NOTE — Progress Notes (Signed)
04/15/2021 2330 Received pt to room 4E04 from PACU.  Pt is A&O, no C/O voiced.  Tele monitor applied and CCMD notified.  CHG bath given.  Oriented pt to room, call light and bed.  Call bell in reach. Kathryne Hitch

## 2021-04-17 ENCOUNTER — Encounter (HOSPITAL_COMMUNITY): Payer: Self-pay | Admitting: Vascular Surgery

## 2021-04-17 ENCOUNTER — Other Ambulatory Visit (HOSPITAL_COMMUNITY): Payer: Self-pay

## 2021-04-17 LAB — CBC
HCT: 32.9 % — ABNORMAL LOW (ref 39.0–52.0)
Hemoglobin: 11.2 g/dL — ABNORMAL LOW (ref 13.0–17.0)
MCH: 29.3 pg (ref 26.0–34.0)
MCHC: 34 g/dL (ref 30.0–36.0)
MCV: 86.1 fL (ref 80.0–100.0)
Platelets: 228 10*3/uL (ref 150–400)
RBC: 3.82 MIL/uL — ABNORMAL LOW (ref 4.22–5.81)
RDW: 12.9 % (ref 11.5–15.5)
WBC: 10.5 10*3/uL (ref 4.0–10.5)
nRBC: 0 % (ref 0.0–0.2)

## 2021-04-17 LAB — HEPARIN LEVEL (UNFRACTIONATED): Heparin Unfractionated: 0.45 IU/mL (ref 0.30–0.70)

## 2021-04-17 MED ORDER — RIVAROXABAN (XARELTO) VTE STARTER PACK (15 & 20 MG)
ORAL_TABLET | ORAL | 0 refills | Status: AC
  Filled 2021-04-17: qty 51, 30d supply, fill #0

## 2021-04-17 MED ORDER — OXYCODONE-ACETAMINOPHEN 5-325 MG PO TABS
1.0000 | ORAL_TABLET | Freq: Four times a day (QID) | ORAL | 0 refills | Status: DC | PRN
  Filled 2021-04-17: qty 20, 5d supply, fill #0

## 2021-04-17 MED ORDER — ROSUVASTATIN CALCIUM 20 MG PO TABS
20.0000 mg | ORAL_TABLET | Freq: Every day | ORAL | 1 refills | Status: AC
  Filled 2021-04-17: qty 30, 30d supply, fill #0

## 2021-04-17 NOTE — Anesthesia Postprocedure Evaluation (Signed)
Anesthesia Post Note  Patient: Derek Rollins  Procedure(s) Performed: RIGHT LOWER EXTREMITY THROMBECTOMY (Right: Leg Lower) FOUR COMPARTMENT FASCIOTOMY (Right: Leg Lower)     Patient location during evaluation: PACU Anesthesia Type: General Level of consciousness: awake and alert Pain management: pain level controlled Vital Signs Assessment: post-procedure vital signs reviewed and stable Respiratory status: spontaneous breathing, nonlabored ventilation, respiratory function stable and patient connected to nasal cannula oxygen Cardiovascular status: blood pressure returned to baseline and stable Postop Assessment: no apparent nausea or vomiting Anesthetic complications: no   No notable events documented.  Last Vitals:  Vitals:   04/17/21 0722 04/17/21 1200  BP: 111/70 114/82  Pulse: 100 92  Resp: 20 15  Temp: 36.8 C 36.6 C  SpO2: 100% 99%    Last Pain:  Vitals:   04/17/21 1200  TempSrc: Oral  PainSc:                  [name] [name]

## 2021-04-17 NOTE — Progress Notes (Addendum)
  Progress Note    04/17/2021 7:23 AM 2 Days Post-Op  Subjective:  Interpreter used for today's visit.  Leg feels much better.  He wants to go home   Vitals:   04/17/21 0347 04/17/21 0722  BP: 105/73 111/70  Pulse: 85 100  Resp: 19 20  Temp: 98 F (36.7 C) 98.2 F (36.8 C)  SpO2: 100% 100%   Physical Exam: Lungs:  non labored Incisions:  R groin soft but ecchymosis; fasciotomy incisions somewhat tight but skin edges are viable, some blistering Extremities:  palpable R DP pulse Abdomen:  soft Neurologic: A&O  CBC    Component Value Date/Time   WBC 10.5 04/17/2021 0246   RBC 3.82 (L) 04/17/2021 0246   HGB 11.2 (L) 04/17/2021 0246   HGB 14.9 04/05/2017 0000   HCT 32.9 (L) 04/17/2021 0246   HCT 44.2 04/05/2017 0000   PLT 228 04/17/2021 0246   PLT 323 04/05/2017 0000   MCV 86.1 04/17/2021 0246   MCV 88 04/05/2017 0000   MCH 29.3 04/17/2021 0246   MCHC 34.0 04/17/2021 0246   RDW 12.9 04/17/2021 0246   RDW 14.5 04/05/2017 0000   LYMPHSABS 1.4 04/15/2021 1622   LYMPHSABS 3.3 (H) 04/05/2017 0000   MONOABS 0.5 04/15/2021 1622   EOSABS 0.0 04/15/2021 1622   EOSABS 0.2 04/05/2017 0000   BASOSABS 0.0 04/15/2021 1622   BASOSABS 0.0 04/05/2017 0000    BMET    Component Value Date/Time   NA 131 (L) 04/16/2021 0259   NA 142 04/05/2017 0000   K 4.5 04/16/2021 0259   CL 100 04/16/2021 0259   CO2 17 (L) 04/16/2021 0259   GLUCOSE 121 (H) 04/16/2021 0259   BUN 10 04/16/2021 0259   BUN 12 04/05/2017 0000   CREATININE 0.80 04/16/2021 0259   CALCIUM 8.2 (L) 04/16/2021 0259   GFRNONAA >60 04/16/2021 0259   GFRAA 114 04/05/2017 0000    INR    Component Value Date/Time   INR 1.0 04/15/2021 1931     Intake/Output Summary (Last 24 hours) at 04/17/2021 0723 Last data filed at 04/17/2021 3546 Gross per 24 hour  Intake 816.06 ml  Output 1700 ml  Net -883.94 ml     Assessment/Plan:  65 y.o. male is s/p RLE thrombectomy 2 Days Post-Op   RLE well perfused with  palpable DP pulse; calf soft Hgb down to 11; daily CBC Echo negative; continue IV heparin, will switch to DOAC at discharge; TOC to check pricing No PT follow up based on therapy eval   Emilie Rutter, PA-C [facility] [phone] 04/17/2021 7:23 AM  I have seen and evaluated the patient. I agree with the PA note as documented above.  Postop day 2 status post right lower extremity thrombectomy with fasciotomies.  Groin incision looks clean and dry.  Fasciotomies were closed with staples and calf remains soft.  Palpable DP PT pulse in the right foot.  He states he is ready for discharge and foot feels much better.  We will hopefully place on DOAC and plan for discharge today.  We will arrange follow-up in 3 weeks for staple removal.  Echocardiogram did not show any thrombus.  I suspect this is in situ from his heavy tobacco abuse.  Discussed the importance of smoking cessation.  Has aspirin allergy.  [name], MD [facility] [place] Office: [phone]

## 2021-04-17 NOTE — Progress Notes (Signed)
ANTICOAGULATION CONSULT NOTE - Follow Up Consult  Pharmacy Consult for heparin Indication:  RLE ischemia now s/p thrombectomy 11/5  Allergies  Allergen Reactions   Aspirin     Face and mouth numbness     Patient Measurements: Height: 5\' 1"  (154.9 cm) Weight: 50.6 kg (111 lb 8.8 oz) IBW/kg (Calculated) : Derek.3 Heparin Dosing Weight: 50.6 kg  Vital Signs: Temp: 98.2 F (36.8 C) (11/07 0722) Temp Source: Oral (11/07 0722) BP: 111/70 (11/07 0722) Pulse Rate: 100 (11/07 0722)  Labs: Recent Labs    04/15/21 1622 04/15/21 1931 04/16/21 0259 04/16/21 1140 04/16/21 2027 04/17/21 0246  HGB 17.1*  --  14.7  --   --  11.2*  HCT 48.8  --  42.6  --   --  32.9*  PLT 309  --  254  --   --  228  LABPROT  --  12.8  --   --   --   --   INR  --  1.0  --   --   --   --   HEPARINUNFRC  --   --   --  0.39 0.48 0.45  CREATININE 0.78  --  0.80  --   --   --     Estimated Creatinine Clearance: 66.8 mL/min (by C-G formula based on SCr of 0.8 mg/dL).   Medications:  Scheduled:   amLODipine  10 mg Oral Daily   docusate sodium  100 mg Oral Daily   pantoprazole  40 mg Oral Daily   rosuvastatin  20 mg Oral Daily   Infusions:   sodium chloride     heparin 900 Units/hr (04/17/21 0729)   magnesium sulfate bolus IVPB      Assessment: 65 yo Rollins continues on heparin for RLE ischemia now s/p thrombectomy.  Heparin remains therapeutic on 900 units/hr.  Noted plans for DOAC at discharge.  Goal of Therapy:  Heparin level 0.3-0.7 units/ml Monitor platelets by anticoagulation protocol: Yes   Plan:  Continue heparin at 900 units/hr Continue daily heparin level and CBC Follow-up DOAC selection and complete patient discharge education  [place], [place].D., BCPS Clinical Pharmacist Clinical phone for 04/17/2021 from 7:30-3:00 is [phone].  **Pharmacist phone directory can be found on [email or web address] listed under [facility].  04/17/2021 9:04 AM

## 2021-04-17 NOTE — Progress Notes (Signed)
Pt has received all meds from [facility] pharmacy. Education on xyrelto provided by pharmacy staff with interpreter assistance.  All other discharge information provided by this RN also with assistance of interpreter.  Pt states understanding and that he has no questions at this time.

## 2021-04-17 NOTE — Discharge Instructions (Signed)
 [facility] [place]  Discharge instructions  Lower Extremity Bypass Surgery  Please refer to the following instruction for your post-procedure care. Your surgeon or physician assistant will discuss any changes with you.  Activity  You are encouraged to walk as much as you can. You can slowly return to normal activities during the month after your surgery. Avoid strenuous activity and heavy lifting until your doctor tells you it's OK. Avoid activities such as vacuuming or swinging a golf club. Do not drive until your doctor give the OK and you are no longer taking prescription pain medications. It is also normal to have difficulty with sleep habits, eating and bowel movement after surgery. These will go away with time.  Bathing/Showering  You may shower after you go home. Do not soak in a bathtub, hot tub, or swim until the incision heals completely.  Incision Care  Clean your incision with mild soap and water. Shower every day. Pat the area dry with a clean towel. You do not need a bandage unless otherwise instructed. Do not apply any ointments or creams to your incision. If you have open wounds you will be instructed how to care for them or a visiting nurse may be arranged for you. If you have staples or sutures along your incision they will be removed at your post-op appointment. You may have skin glue on your incision. Do not peel it off. It will come off on its own in about one week. If you have a great deal of moisture in your groin, use a gauze help keep this area dry.  Diet  Resume your normal diet. There are no special food restrictions following this procedure. A low fat/ low cholesterol diet is recommended for all patients with vascular disease. In order to heal from your surgery, it is CRITICAL to get adequate nutrition. Your body requires vitamins, minerals, and protein. Vegetables are the best source of vitamins and minerals. Vegetables also provide the  perfect balance of protein. Processed food has little nutritional value, so try to avoid this.  Medications  Resume taking all your medications unless your doctor or nurse practitioner tells you not to. If your incision is causing pain, you may take over-the-counter pain relievers such as acetaminophen (Tylenol). If you were prescribed a stronger pain medication, please aware these medication can cause nausea and constipation. Prevent nausea by taking the medication with a snack or meal. Avoid constipation by drinking plenty of fluids and eating foods with high amount of fiber, such as fruits, vegetables, and grains. Take Colase 100 mg (an over-the-counter stool softener) twice a day as needed for constipation. Do not take Tylenol if you are taking prescription pain medications.  Follow Up  Our office will schedule a follow up appointment 2-3 weeks following discharge.  Please call us immediately for any of the following conditions  Severe or worsening pain in your legs or feet while at rest or while walking Increase pain, redness, warmth, or drainage (pus) from your incision site(s) Fever of 101 degree or higher The swelling in your leg with the bypass suddenly worsens and becomes more painful than when you were in the hospital If you have been instructed to feel your graft pulse then you should do so every day. If you can no longer feel this pulse, call the office immediately. Not all patients are given this instruction.  Leg swelling is common after leg bypass surgery.  The swelling should improve over a few months   following surgery. To improve the swelling, you may elevate your legs above the level of your heart while you are sitting or resting. Your surgeon or physician assistant may ask you to apply an ACE wrap or wear compression (TED) stockings to help to reduce swelling.  Reduce your risk of vascular disease  Stop smoking. If you would like help call [facility] at 1-800-QUIT-NOW  (1-800-784-8669) or [place] at 336-586-4000.  Manage your cholesterol Maintain a desired weight Control your diabetes weight Control your diabetes Keep your blood pressure down  If you have any questions, please call the office at [phone]   

## 2021-04-17 NOTE — Progress Notes (Signed)
Pt grew impatient of waiting on rolling walker and insists on being discharged without it.  States his brother is outside waiting to take him home.  Case management notified.  Pt transported off unit via WC.

## 2021-04-17 NOTE — Progress Notes (Addendum)
1530 Received call from bedside RN- pt requesting RW for home, Order has been placed for DME- call made to [employer] for DME- RW to be delivered to room prior to discharge.  1630- informed by bedside RN pt no longer wanted to wait for DME delivery- will call and ask [employer] if they can have RW shipped to pt's home.   Post discharge call made to [employer] on 04/18/21 1000- regarding RW- per Freda Munro at [facility] they have spoken with pt about RW- and pt decided he would wait on getting the RW due to the cost as he has not met his deductible yet.

## 2021-04-17 NOTE — TOC Benefit Eligibility Note (Signed)
Transition of Care [facility]) Benefit Eligibility Note    Patient Details  Name: Derek Rollins MRN: [id number] Date of Birth: November 16, 1955   Medication/Dose: Eliquis 5mg .bid for 30 day supply and or Xarelto 20mg . daily  Covered?: Yes  Tier: 3 Drug  Prescription Coverage Preferred Pharmacy: [facility]  Spoke with Person/Company/Phone Number:: PH# [phone]  Co-Pay: $9.85.  Prior Approval: No  Deductible: Unmet       Irwin Brakeman Phone Number: 04/17/2021, 2:19 PM

## 2021-04-18 NOTE — Discharge Summary (Signed)
  Discharge Summary  Patient ID: Derek Rollins [id number] 65 y.o. 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  He was brought emergently to the operating room and underwent right lower extremity thrombectomy via transfemoral approach as well as 4 compartment fasciotomy.  He tolerated the procedure well and was admitted to the hospital postoperatively.  Embolic work-up involving echocardiogram was negative for thrombus source.  He was maintained on IV heparin during hospitalization.  At the time of discharge she had a palpable right DP and PT pulse.  He was transitioned to Xarelto.  He will follow-up in 3 weeks for staple removal.  He has been aspirin allergy.  He was also prescribed narcotic pain medication for continued postoperative pain control.  He was also prescribed a statin to take daily.  He was discharged home in stable condition.  Consults: None  Treatments: surgery: Right lower extremity thrombectomy and 4 compartment fasciotomy by Dr. Chestine Spore on 04/15/2021  Discharge Exam: See progress note 04/17/21 Vitals:   04/17/21 0722 04/17/21 1200  BP: 111/70 114/82  Pulse: 100 92  Resp: 20 15  Temp: 98.2 F (36.8 C) 97.9 F (36.6 C)  SpO2: 100% 99%     Disposition: Discharge disposition: 01-Home or Self Care       Patient Instructions:  Allergies as of 04/17/2021       Reactions   Aspirin    Face and mouth numbness         Medication List      TAKE these medications    amLODipine 10 MG tablet Commonly known as: NORVASC Take 1 tablet (10 mg total) by mouth daily.   meloxicam 7.5 MG tablet Commonly known as: MOBIC 1 tab by mouth daily with meal as needed for pain   oxyCODONE-acetaminophen 5-325 MG tablet Commonly known as: PERCOCET/ROXICET Take 1 tablet by mouth every 6 (six) hours as needed for moderate pain.   rosuvastatin 20 MG tablet Commonly known as: CRESTOR Ngy u?ng 1 l?n, m?i l?n 1 vin (t?ng c?ng 20 mg). (Take 1 tablet (20 mg total) by mouth daily.)   Xarelto Starter Pack Generic drug: Rivaroxaban Stater Pack (15 mg and 20 mg) Follow package directions: Take one 15mg  tablet by mouth twice a day. On day 22, switch to one 20mg  tablet once a day. Take with food.       Activity: activity as tolerated Diet: regular diet Wound Care: keep wound clean and dry  Follow-up with [facility] in 3 weeks.  Signed: , PA-C 04/18/2021 7:40 AM [facility] Office: [phone]

## 2021-05-09 ENCOUNTER — Other Ambulatory Visit: Payer: Self-pay

## 2021-05-09 ENCOUNTER — Encounter (HOSPITAL_COMMUNITY): Payer: Self-pay | Admitting: Vascular Surgery

## 2021-05-09 ENCOUNTER — Ambulatory Visit (INDEPENDENT_AMBULATORY_CARE_PROVIDER_SITE_OTHER): Payer: Medicare Other | Admitting: Physician Assistant

## 2021-05-09 VITALS — BP 177/106 | HR 85 | Temp 98.0°F | Resp 20 | Ht 61.0 in | Wt 107.2 lb

## 2021-05-09 DIAGNOSIS — T8189XA Other complications of procedures, not elsewhere classified, initial encounter: Secondary | ICD-10-CM

## 2021-05-09 DIAGNOSIS — I998 Other disorder of circulatory system: Secondary | ICD-10-CM

## 2021-05-09 NOTE — Progress Notes (Addendum)
Derek Rollins denies chest pain or shortness at present.  Derek Rollins states that every 5 days or so, pain is sharp to "heart", left nipple area. Pain score  - 6.  Patient has experienced this pain for approximately 6 months. Patient has not informed MD. Dr. Delrae Alfred is patient's PCP, patient had not seen Dr. Delrae Alfred since 2019.  Derek Rollins reports that he only takes his blood pressure- Rosuvastatin, patient spelled the medication.  I instructed Derek Rollins to shower with antibiotic soap, if it is available.  Dry off with a clean towel. Do not put lotion, powder, cologne or deodorant or makeup.No jewelry or piercings. Men may shave their face and neck. Woman should not shave. No nail polish, artificial or acrylic nails. Wear clean clothes, brush your teeth. Glasses, contact lens,dentures or partials may not be worn in the OR. If you need to wear them, please bring a case for glasses, do not wear contacts or bring a case, the hospital does not have contact cases, dentures or partials will have to be removed , make sure they are clean, we will provide a denture cup to put them in. You will need some one to drive you home and a responsible person over the age of 4 to stay with you for the first 24 hours after surgery.

## 2021-05-09 NOTE — Progress Notes (Signed)
Anesthesia Chart Review:  Case: [id number] Date/Time: 05/10/21 0715   Procedure: RIGHT LEG IRRIGATION AND DEBRIDEMENT WOUND (Right)   Anesthesia type: Choice   Pre-op diagnosis: non healing surgical wound   [facility] ROOM 12 / [facility]   Surgeons: [name], MD       DISCUSSION: Patient is a 65 year old male scheduled for the above procedure.   History includes smoking, HTN, spinal surgery (1973; L4-5 laminectomy 09/24/14), THA (right 06/28/14), ischemic RLE (s/p thrombectomy, fasciotomies 04/15/21). He speaks [place]   [place] admission 04/15/21-04/17/21 for acutely RLE ischemia, s/p emergent RLE thrombectomy and 4 compartment fasciotomy on 04/15/21. He was started on IV Heparin and transitioned to Xarelto (reported ASA allergy).  Embolic work-up included echocardiogram which did not reveal cardiac source of thrombus, but did show mildly decreased LV function with EF 40-45% with global hypokinesis. Dr. Chestine Spore suspected, "this is in situ for his heavy tobacco abuse" and encouraged smoking cessation.    He was seen at [facility] by Emilie Rutter, PA-C on 05/09/21. Patient continued to smoke and had stopped taking Xarelto. RLE with palpable DP/PT pulses and right groin incision healed, but fasciotomy incisions with skin necrosis and surrounding erythema. The above procedure recommended.   He tolerated vascular surgery earlier this month, but unfortunately has non-healing fasciotomy sites and now needs wound irrigation. Choice anesthesia posted. Anesthesia to to evaluate on the day of surgery. Surgery is posted as ambulatory. PAT phone RN note reviewed. Will reach out to vascular and see if they are able to facilitate getting him re-established with primary care in the near future, as currently with untreated HTN and mild LV dysfunction on recent echo.    VS:  BP Readings from Last 3 Encounters:  05/09/21 (!) 177/106  04/17/21 114/82  11/26/17 134/82   Pulse Readings from Last 3  Encounters:  05/09/21 85  04/17/21 92  11/26/17 98     PROVIDERS: Julieanne Manson, MD is listed as PCP, although last visit seen at the [facility] clinic was on 08/26/17.    LABS: Labs on day of surgery as indicated. Currently, last results include: Lab Results  Component Value Date   WBC 10.5 04/17/2021   HGB 11.2 (L) 04/17/2021   HCT 32.9 (L) 04/17/2021   PLT 228 04/17/2021   GLUCOSE 121 (H) 04/16/2021   NA 131 (L) 04/16/2021   K 4.5 04/16/2021   CL 100 04/16/2021   CREATININE 0.80 04/16/2021   BUN 10 04/16/2021   CO2 17 (L) 04/16/2021   INR 1.0 04/15/2021     IMAGES: CTA Ao+BiFem [PRE-RLE Thrombectomy] 04/15/21: IMPRESSION: 1. 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 2. Complete occlusion of the right anterior tibial, posterior tibial, and peroneal arteries throughout their courses. 3. Patent left lower extremity arterial vasculature. 4. Patent three-vessel runoff to the left ankle. 5. No evidence of acute abnormalities within the abdomen or pelvis. 6. Aortic atherosclerosis and atherosclerosis of the named arteries of the lower extremities.   MRI L-spine 04/15/21: IMPRESSION: 1. Lumbar spondylosis and degenerative disc disease, causing moderate impingement at L3-4 and L4-5. The dominant right-sided findings are at L4-5 where there is moderate right foraminal stenosis and moderate right subarticular lateral recess stenosis. 2. 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 3.  Aortic Atherosclerosis (  ICD10-I70.0).      EKG: Sinus rhythm Sinus pause pronounced inferior Q wave. no STEMI. no old comparison Confirmed by Arby Barrette [phone]) on 04/16/2021 1:31:34 PM - Sinus pause measured at just over 1 second  CV: Echo  04/16/21: IMPRESSIONS   1. Left ventricular ejection fraction, by estimation, is 40 to 45%. The  left ventricle has mildly decreased function. The left ventricle  demonstrates global hypokinesis. There is mild left ventricular  hypertrophy. Left ventricular diastolic parameters  are consistent with Grade I diastolic dysfunction (impaired relaxation).   2. Right ventricular systolic function is normal. The right ventricular  size is normal.   3. The mitral valve is normal in structure. No evidence of mitral valve  regurgitation. No evidence of mitral stenosis.   4. The aortic valve was not well visualized. Aortic valve regurgitation  is not visualized. No aortic stenosis is present.   5. The inferior vena cava is normal in size with greater than 50%  respiratory variability, suggesting right atrial pressure of 3 mmHg.    Past Medical History:  Diagnosis Date   Arthritis    Hypertension     Past Surgical History:  Procedure Laterality Date   FASCIOTOMY Right 04/15/2021   Procedure: FOUR COMPARTMENT FASCIOTOMY;  Surgeon: [name], MD;  Location: [facility] OR;  Service: Vascular;  Laterality: Right;   LUMBAR DISC SURGERY  1973   LUMBAR LAMINECTOMY/DECOMPRESSION MICRODISCECTOMY Left 09/24/2014   Procedure: CENTRAL DECOMPRESSION  LUMBAR LAMENECTOMYL 4-L5, FORAMENOTOMY L 4-5 ROOT FOR FORAMIAL STENOSIS, EXCISION OF SCAR TISSUE L 4-5;  Surgeon: Ranee Gosselin, MD;  Location: [facility] ORS;  Service: Orthopedics;  Laterality: Left;   THROMBECTOMY OF BYPASS GRAFT FEMORAL- TIBIAL ARTERY Right 04/15/2021   Procedure: RIGHT LOWER EXTREMITY THROMBECTOMY;  Surgeon: [name], MD;  Location: [facility] OR;  Service: Vascular;  Laterality: Right;   TOTAL HIP ARTHROPLASTY Right 06/28/2014   Procedure: RIGHT TOTAL HIP ARTHROPLASTY;  Surgeon: Jacki Cones, MD;  Location: [facility] ORS;  Service: Orthopedics;  Laterality: Right;    MEDICATIONS: No current facility-administered medications for this encounter.     oxyCODONE-acetaminophen (PERCOCET/ROXICET) 5-325 MG tablet   rosuvastatin (CRESTOR) 20 MG tablet   amLODipine (NORVASC) 10 MG tablet   meloxicam (MOBIC) 7.5 MG tablet   RIVAROXABAN (XARELTO) VTE STARTER PACK (15 & 20 MG)    Shonna Chock, PA-C Surgical Short Stay/Anesthesiology [facility] Phone [phone] [facility] Phone [phone] 05/09/2021 4:26 PM

## 2021-05-09 NOTE — Anesthesia Preprocedure Evaluation (Addendum)
Anesthesia Evaluation  Patient identified by MRN, date of birth, ID band Patient awake    Reviewed: Allergy & Precautions, NPO status , Patient's Chart, lab work & pertinent test results  Airway Mallampati: II  TM Distance: >3 FB Neck ROM: Full    Dental  (+) Dental Advisory Given, Poor Dentition, Missing, Chipped   Pulmonary Current SmokerPatient did not abstain from smoking.,    Pulmonary exam normal breath sounds clear to auscultation       Cardiovascular hypertension, Pt. on medications + Peripheral Vascular Disease and +CHF  Normal cardiovascular exam Rhythm:Regular Rate:Normal  Echo 04/16/21: IMPRESSIONS  1. Left ventricular ejection fraction, by estimation, is 40 to 45%. The left ventricle has mildly decreased function. The left ventricle demonstrates global hypokinesis. There is mild left ventricular hypertrophy. Left ventricular diastolic parameters are consistent with Grade I diastolic dysfunction (impaired relaxation).  2. Right ventricular systolic function is normal. The right ventricular size is normal.  3. The mitral valve is normal in structure. No evidence of mitral valve regurgitation. No evidence of mitral stenosis.  4. The aortic valve was not well visualized. Aortic valve regurgitation is not visualized. No aortic stenosis is present.  5. The inferior vena cava is normal in size with greater than 50% respiratory variability, suggesting right atrial pressure of 3 mmHg.     Neuro/Psych negative neurological ROS     GI/Hepatic negative GI ROS, Neg liver ROS,   Endo/Other  negative endocrine ROS  Renal/GU negative Renal ROS     Musculoskeletal  (+) Arthritis ,   Abdominal   Peds  Hematology negative hematology ROS (+)   Anesthesia Other Findings   Reproductive/Obstetrics                                                           Anesthesia Evaluation  Patient identified by  MRN, date of birth, ID band Patient awake    Reviewed: Allergy & Precautions, NPO status , Patient's Chart, lab work & pertinent test results  Airway Mallampati: II  TM Distance: >3 FB Neck ROM: Full    Dental no notable dental hx.    Pulmonary neg pulmonary ROS, Current Smoker,    Pulmonary exam normal        Cardiovascular hypertension, Pt. on medications + Peripheral Vascular Disease (acute RLE ischemia )   Rhythm:Regular Rate:Normal     Neuro/Psych negative neurological ROS  negative psych ROS   GI/Hepatic negative GI ROS, Neg liver ROS,   Endo/Other  negative endocrine ROS  Renal/GU negative Renal ROS  negative genitourinary   Musculoskeletal  (+) Arthritis , Osteoarthritis,    Abdominal Normal abdominal exam  (+)   Peds  Hematology negative hematology ROS (+)   Anesthesia Other Findings Video interpreter used (vietnamese). All questions answered.  Patient reports NPO since 8 am. No family present with patient.  Reproductive/Obstetrics                           Anesthesia Physical Anesthesia Plan  ASA: 2 and emergent  Anesthesia Plan: General   Post-op Pain Management:    Induction: Intravenous  PONV Risk Score and Plan: 2 and Ondansetron, Dexamethasone, Midazolam and Treatment may vary due to age or medical condition  Airway Management Planned: Mask and  Oral ETT  Additional Equipment: None  Intra-op Plan:   Post-operative Plan: Extubation in OR  Informed Consent: I have reviewed the patients History and Physical, chart, labs and discussed the procedure including the risks, benefits and alternatives for the proposed anesthesia with the patient or authorized representative who has indicated his/her understanding and acceptance.     Dental advisory given and Interpreter used for interveiw  Plan Discussed with: CRNA  Anesthesia Plan Comments: (Lab Results      Component                Value                Date                      WBC                      9.3                 04/15/2021                HGB                      17.1 (H)            04/15/2021                HCT                      48.8                04/15/2021                MCV                      83.3                04/15/2021                PLT                      309                 04/15/2021           Lab Results      Component                Value               Date                      NA                       132 (L)             04/15/2021                K                        4.5                 04/15/2021                CO2                      22  04/15/2021                GLUCOSE                  111 (H)             04/15/2021                BUN                      7 (L)               04/15/2021                CREATININE               0.78                04/15/2021                CALCIUM                  9.1                 04/15/2021                GFRNONAA                 >60                 04/15/2021          )       Anesthesia Quick Evaluation  Anesthesia Physical Anesthesia Plan  ASA: 4  Anesthesia Plan: General   Post-op Pain Management: Tylenol PO (pre-op)   Induction: Intravenous  PONV Risk Score and Plan: 2 and Ondansetron, Dexamethasone, Treatment may vary due to age or medical condition and Midazolam  Airway Management Planned: LMA  Additional Equipment: None  Intra-op Plan:   Post-operative Plan: Extubation in OR  Informed Consent: I have reviewed the patients History and Physical, chart, labs and discussed the procedure including the risks, benefits and alternatives for the proposed anesthesia with the patient or authorized representative who has indicated his/her understanding and acceptance.     Dental advisory given  Plan Discussed with: CRNA  Anesthesia Plan Comments: (PAT note written 05/09/2021 by Shonna Chock, PA-C. )      Anesthesia  Quick Evaluation

## 2021-05-09 NOTE — Progress Notes (Signed)
Office Note     CC:  follow up Requesting Provider:  Julieanne Manson, MD  HPI: Derek Rollins is a 65 y.o. (03/03/1956) male who presents for postoperative follow-up status post right lower extremity thrombectomy via transfemoral approach as well as 4 compartment fasciotomy by Dr. Chestine Spore on 04/15/2021 due to acute limb ischemia.  Embolic work-up postoperatively was negative.  He was discharged home on Xarelto.  He is seen today using a Nurse, learning disability.  He states his right groin incision is well-healed however he is worried about his fasciotomy incisions.  He denies fevers, chills, nausea/vomiting.  He states he has not been taking his Xarelto.  He continues to smoke daily.   Past Medical History:  Diagnosis Date   Arthritis    Hypertension     Past Surgical History:  Procedure Laterality Date   FASCIOTOMY Right 04/15/2021   Procedure: FOUR COMPARTMENT FASCIOTOMY;  Surgeon: [name], MD;  Location: [facility] OR;  Service: Vascular;  Laterality: Right;   LUMBAR DISC SURGERY  1973   LUMBAR LAMINECTOMY/DECOMPRESSION MICRODISCECTOMY Left 09/24/2014   Procedure: CENTRAL DECOMPRESSION  LUMBAR LAMENECTOMYL 4-L5, FORAMENOTOMY L 4-5 ROOT FOR FORAMIAL STENOSIS, EXCISION OF SCAR TISSUE L 4-5;  Surgeon: Ranee Gosselin, MD;  Location: [facility] ORS;  Service: Orthopedics;  Laterality: Left;   THROMBECTOMY OF BYPASS GRAFT FEMORAL- TIBIAL ARTERY Right 04/15/2021   Procedure: RIGHT LOWER EXTREMITY THROMBECTOMY;  Surgeon: [name], MD;  Location: [facility] OR;  Service: Vascular;  Laterality: Right;   TOTAL HIP ARTHROPLASTY Right 06/28/2014   Procedure: RIGHT TOTAL HIP ARTHROPLASTY;  Surgeon: Jacki Cones, MD;  Location: [facility] ORS;  Service: Orthopedics;  Laterality: Right;    Social History   Socioeconomic History   Marital status: Married    Spouse name: Separated   Number of children: 2   Years of education: 12   Highest education level: Not on file  Occupational History   Occupation: Unemployed      Comment: Working on achieving disability  Tobacco Use   Smoking status: Every Day    Packs/day: 0.50    Years: 24.00    Pack years: 12.00    Types: Cigarettes   Smokeless tobacco: Never   Tobacco comments:    Did call quit line with a student helping, but for some reason thought he was instructed to call 911.  Discussed that is a message only to call if having an emergency and needs to listen to entire message next time.  Interpreter stated she would help him  Substance and Sexual Activity   Alcohol use: Yes    Alcohol/week: 0.0 standard drinks    Comment: Occasional  on weekends--maybe 6 beers once a month  and karaoke with friends   Drug use: No   Sexual activity: Not on file  Other Topics Concern   Not on file  Social History Narrative   Born and raised in [place].   Is now separated from his wife and lives alone.   Has been unable to support his two sons, so they live with his brother in [place]   Fun: Can't do much now - sing karoke.   Social Determinants of Health   Financial Resource Strain: Not on file  Food Insecurity: Not on file  Transportation Needs: Not on file  Physical Activity: Not on file  Stress: Not on file  Social Connections: Not on file  Intimate Partner Violence: Not on file   History reviewed. No pertinent family history.  Current Outpatient Medications  Medication Sig Dispense Refill   oxyCODONE-acetaminophen (PERCOCET/ROXICET) 5-325 MG tablet Take 1 tablet by mouth every 6 (six) hours as needed for moderate pain. 20 tablet 0   rosuvastatin (CRESTOR) 20 MG tablet Take 1 tablet (20 mg total) by mouth daily. 30 tablet 1   amLODipine (NORVASC) 10 MG tablet Take 1 tablet (10 mg total) by mouth daily. (Patient not taking: Reported on 04/05/2017) 30 tablet 11   meloxicam (MOBIC) 7.5 MG tablet 1 tab by mouth daily with meal as needed for pain (Patient not taking: Reported on 04/16/2021) 30 tablet 6   RIVAROXABAN (XARELTO) VTE STARTER PACK (15 & 20 MG)  Follow package directions: Take one 15mg  tablet by mouth twice a day. On day 22, switch to one 20mg  tablet once a day. Take with food. (Patient not taking: Reported on 05/09/2021) 51 each 0   No current facility-administered medications for this visit.    Allergies  Allergen Reactions   Aspirin     Face and mouth numbness      REVIEW OF SYSTEMS:   [X]  denotes positive finding, [ ]  denotes negative finding Cardiac  Comments:  Chest pain or chest pressure:    Shortness of breath upon exertion:    Short of breath when lying flat:    Irregular heart rhythm:        Vascular    Pain in calf, thigh, or hip brought on by ambulation:    Pain in feet at night that wakes you up from your sleep:     Blood clot in your veins:    Leg swelling:         Pulmonary    Oxygen at home:    Productive cough:     Wheezing:         Neurologic    Sudden weakness in arms or legs:     Sudden numbness in arms or legs:     Sudden onset of difficulty speaking or slurred speech:    Temporary loss of vision in one eye:     Problems with dizziness:         Gastrointestinal    Blood in stool:     Vomited blood:         Genitourinary    Burning when urinating:     Blood in urine:        Psychiatric    Major depression:         Hematologic    Bleeding problems:    Problems with blood clotting too easily:        Skin    Rashes or ulcers:        Constitutional    Fever or chills:      PHYSICAL EXAMINATION:  Vitals:   05/09/21 1115  BP: (!) 177/106  Pulse: 85  Resp: 20  Temp: 98 F (36.7 C)  TempSrc: Temporal  SpO2: 98%  Weight: 107 lb 3.2 oz (48.6 kg)  Height: 5\' 1"  (1.549 m)    General:  WDWN in NAD; vital signs documented above Gait: Not observed HENT: WNL, normocephalic Pulmonary: normal non-labored breathing Cardiac: regular HR, without  Murmurs  Abdomen: soft, NT, no masses Skin: without rashes Vascular Exam/Pulses:  Right Left  Radial 2+ (normal) 2+ (normal)  DP  2+ (normal) 2+ (normal)  PT 2+ (normal) 2+ (normal)   Extremities: Right groin incision well-healed; right medial fasciotomy incision with erythema however no drainage; right lateral fasciotomy incision with skin necrosis and surrounding erythema Musculoskeletal:  no muscle wasting or atrophy  Neurologic: A&O X 3;  No focal weakness or paresthesias are detected Psychiatric:  The pt has Normal affect.    ASSESSMENT/PLAN:: 65 y.o. male status post right lower extremity thrombectomy and 4 compartment fasciotomy due to acute limb ischemia  -Right lower extremity is well-perfused with easily palpable DP and PT pulses -Right groin incision is well-healed -Unfortunately fasciotomy incisions are not healing well, especially lateral incision.  He has skin necrosis with surrounding erythema.  Plan will be to return to the operating room tomorrow with Dr. Chestine Spore for at least a lateral fasciotomy debridement possible medial fasciotomy debridement.  We will also make sure he has a prescription for Xarelto at discharge. -Translator was used for today's visit including explaining the nature of the above procedure and he agrees to proceed   Emilie Rutter, PA-C [facility] [phone]  Clinic MD:   Chestine Spore

## 2021-05-10 ENCOUNTER — Encounter (HOSPITAL_COMMUNITY): Payer: Self-pay | Admitting: Vascular Surgery

## 2021-05-10 ENCOUNTER — Other Ambulatory Visit: Payer: Self-pay

## 2021-05-10 ENCOUNTER — Ambulatory Visit (HOSPITAL_COMMUNITY): Payer: Medicare Other | Admitting: Vascular Surgery

## 2021-05-10 ENCOUNTER — Encounter (HOSPITAL_COMMUNITY)
Admission: RE | Disposition: A | Discharge status: Home / Self Care | Payer: Self-pay | Source: Home / Self Care | Attending: Vascular Surgery

## 2021-05-10 ENCOUNTER — Ambulatory Visit (HOSPITAL_COMMUNITY)
Admission: RE | Admit: 2021-05-10 | Discharge: 2021-05-10 | Disposition: A | Discharge status: Home / Self Care | Payer: Medicare Other | Attending: Vascular Surgery | Admitting: Vascular Surgery

## 2021-05-10 DIAGNOSIS — T8189XA Other complications of procedures, not elsewhere classified, initial encounter: Secondary | ICD-10-CM | POA: Diagnosis not present

## 2021-05-10 DIAGNOSIS — X58XXXA Exposure to other specified factors, initial encounter: Secondary | ICD-10-CM | POA: Insufficient documentation

## 2021-05-10 DIAGNOSIS — F17200 Nicotine dependence, unspecified, uncomplicated: Secondary | ICD-10-CM | POA: Diagnosis not present

## 2021-05-10 DIAGNOSIS — T8142XA Infection following a procedure, deep incisional surgical site, initial encounter: Secondary | ICD-10-CM | POA: Diagnosis not present

## 2021-05-10 HISTORY — DX: Peripheral vascular disease, unspecified: I73.9

## 2021-05-10 HISTORY — PX: INCISION AND DRAINAGE OF WOUND: SHX1803

## 2021-05-10 LAB — POCT I-STAT, CHEM 8
BUN: 6 mg/dL — ABNORMAL LOW (ref 8–23)
Calcium, Ion: 1.17 mmol/L (ref 1.15–1.40)
Chloride: 104 mmol/L (ref 98–111)
Creatinine, Ser: 0.7 mg/dL (ref 0.61–1.24)
Glucose, Bld: 95 mg/dL (ref 70–99)
HCT: 41 % (ref 39.0–52.0)
Hemoglobin: 13.9 g/dL (ref 13.0–17.0)
Potassium: 4.4 mmol/L (ref 3.5–5.1)
Sodium: 137 mmol/L (ref 135–145)
TCO2: 23 mmol/L (ref 22–32)

## 2021-05-10 SURGERY — IRRIGATION AND DEBRIDEMENT WOUND
Anesthesia: General | Site: Leg Lower | Laterality: Right

## 2021-05-10 MED ORDER — CEFAZOLIN SODIUM-DEXTROSE 2-4 GM/100ML-% IV SOLN
2.0000 g | INTRAVENOUS | Status: AC
  Administered 2021-05-10: 2 g via INTRAVENOUS
  Filled 2021-05-10: qty 100

## 2021-05-10 MED ORDER — LACTATED RINGERS IV SOLN: INTRAVENOUS | Status: DC

## 2021-05-10 MED ORDER — ONDANSETRON HCL 4 MG/2ML IJ SOLN
INTRAMUSCULAR | Status: DC | PRN
  Administered 2021-05-10: 4 mg via INTRAVENOUS

## 2021-05-10 MED ORDER — ORAL CARE MOUTH RINSE: 15.0000 mL | Freq: Once | OROMUCOSAL | Status: AC

## 2021-05-10 MED ORDER — MIDAZOLAM HCL 2 MG/2ML IJ SOLN
INTRAMUSCULAR | Status: AC
  Filled 2021-05-10: qty 2

## 2021-05-10 MED ORDER — PROPOFOL 1000 MG/100ML IV EMUL
INTRAVENOUS | Status: AC
  Filled 2021-05-10: qty 100

## 2021-05-10 MED ORDER — FENTANYL CITRATE (PF) 250 MCG/5ML IJ SOLN
INTRAMUSCULAR | Status: AC
  Filled 2021-05-10: qty 5

## 2021-05-10 MED ORDER — FENTANYL CITRATE (PF) 250 MCG/5ML IJ SOLN
INTRAMUSCULAR | Status: DC | PRN
  Administered 2021-05-10: 50 ug via INTRAVENOUS
  Administered 2021-05-10: 25 ug via INTRAVENOUS

## 2021-05-10 MED ORDER — PROPOFOL 10 MG/ML IV BOLUS
INTRAVENOUS | Status: DC | PRN
  Administered 2021-05-10: 30 mg via INTRAVENOUS

## 2021-05-10 MED ORDER — CHLORHEXIDINE GLUCONATE 0.12 % MT SOLN
15.0000 mL | Freq: Once | OROMUCOSAL | Status: AC
  Administered 2021-05-10: 15 mL via OROMUCOSAL
  Filled 2021-05-10: qty 15

## 2021-05-10 MED ORDER — FENTANYL CITRATE (PF) 100 MCG/2ML IJ SOLN
INTRAMUSCULAR | Status: AC
  Filled 2021-05-10: qty 2

## 2021-05-10 MED ORDER — DEXAMETHASONE SODIUM PHOSPHATE 10 MG/ML IJ SOLN
INTRAMUSCULAR | Status: AC
  Filled 2021-05-10: qty 1

## 2021-05-10 MED ORDER — MEPERIDINE HCL 25 MG/ML IJ SOLN: 6.2500 mg | INTRAMUSCULAR | Status: DC | PRN

## 2021-05-10 MED ORDER — PROMETHAZINE HCL 25 MG/ML IJ SOLN: 6.2500 mg | INTRAMUSCULAR | Status: DC | PRN

## 2021-05-10 MED ORDER — SODIUM CHLORIDE 0.9 % IV SOLN: INTRAVENOUS | Status: DC

## 2021-05-10 MED ORDER — FENTANYL CITRATE (PF) 100 MCG/2ML IJ SOLN
25.0000 ug | INTRAMUSCULAR | Status: DC | PRN
  Administered 2021-05-10: 50 ug via INTRAVENOUS
  Administered 2021-05-10: 25 ug via INTRAVENOUS

## 2021-05-10 MED ORDER — MIDAZOLAM HCL 2 MG/2ML IJ SOLN
INTRAMUSCULAR | Status: DC | PRN
  Administered 2021-05-10: 2 mg via INTRAVENOUS

## 2021-05-10 MED ORDER — 0.9 % SODIUM CHLORIDE (POUR BTL) OPTIME
TOPICAL | Status: DC | PRN
  Administered 2021-05-10: 1000 mL

## 2021-05-10 MED ORDER — OXYCODONE-ACETAMINOPHEN 5-325 MG PO TABS: 1.0000 | ORAL_TABLET | Freq: Four times a day (QID) | ORAL | 0 refills | Status: AC | PRN

## 2021-05-10 MED ORDER — PROPOFOL 10 MG/ML IV BOLUS
INTRAVENOUS | Status: AC
  Filled 2021-05-10: qty 20

## 2021-05-10 MED ORDER — ONDANSETRON HCL 4 MG/2ML IJ SOLN
INTRAMUSCULAR | Status: AC
  Filled 2021-05-10: qty 2

## 2021-05-10 MED ORDER — PROPOFOL 500 MG/50ML IV EMUL
INTRAVENOUS | Status: DC | PRN
  Administered 2021-05-10: 75 ug/kg/min via INTRAVENOUS

## 2021-05-10 MED ORDER — CHLORHEXIDINE GLUCONATE 4 % EX LIQD: 60.0000 mL | Freq: Once | CUTANEOUS | Status: DC

## 2021-05-10 MED ORDER — LIDOCAINE HCL (PF) 1 % IJ SOLN
INTRAMUSCULAR | Status: AC
  Filled 2021-05-10: qty 30

## 2021-05-10 MED ORDER — DEXAMETHASONE SODIUM PHOSPHATE 10 MG/ML IJ SOLN
INTRAMUSCULAR | Status: DC | PRN
  Administered 2021-05-10: 5 mg via INTRAVENOUS

## 2021-05-10 MED ORDER — LIDOCAINE-EPINEPHRINE (PF) 1 %-1:200000 IJ SOLN
INTRAMUSCULAR | Status: AC
  Filled 2021-05-10: qty 30

## 2021-05-10 SURGICAL SUPPLY — 42 items
BAG COUNTER SPONGE SURGICOUNT (BAG) ×2 IMPLANT
BAG SPNG CNTER NS LX DISP (BAG) ×1
BNDG ELASTIC 4X5.8 VLCR STR LF (GAUZE/BANDAGES/DRESSINGS) ×1 IMPLANT
BNDG ELASTIC 6X5.8 VLCR STR LF (GAUZE/BANDAGES/DRESSINGS) ×1 IMPLANT
BNDG GAUZE ELAST 4 BULKY (GAUZE/BANDAGES/DRESSINGS) ×1 IMPLANT
CANISTER SUCT 3000ML PPV (MISCELLANEOUS) ×2 IMPLANT
CLIP VESOCCLUDE MED 6/CT (CLIP) ×2 IMPLANT
CLIP VESOCCLUDE SM WIDE 6/CT (CLIP) ×2 IMPLANT
COVER SURGICAL LIGHT HANDLE (MISCELLANEOUS) ×2 IMPLANT
DRAPE HALF SHEET 40X57 (DRAPES) IMPLANT
DRAPE U-SHAPE 76X120 STRL (DRAPES) IMPLANT
DRSG PAD ABDOMINAL 8X10 ST (GAUZE/BANDAGES/DRESSINGS) ×1 IMPLANT
ELECT REM PT RETURN 9FT ADLT (ELECTROSURGICAL) ×2
ELECTRODE REM PT RTRN 9FT ADLT (ELECTROSURGICAL) ×1 IMPLANT
GAUZE SPONGE 4X4 12PLY STRL (GAUZE/BANDAGES/DRESSINGS) ×2 IMPLANT
GAUZE XEROFORM 5X9 LF (GAUZE/BANDAGES/DRESSINGS) ×1 IMPLANT
GLOVE SRG 8 PF TXTR STRL LF DI (GLOVE) ×1 IMPLANT
GLOVE SURG ENC MOIS LTX SZ7.5 (GLOVE) ×2 IMPLANT
GLOVE SURG UNDER POLY LF SZ8 (GLOVE) ×2
GOWN STRL REUS W/ TWL LRG LVL3 (GOWN DISPOSABLE) ×2 IMPLANT
GOWN STRL REUS W/ TWL XL LVL3 (GOWN DISPOSABLE) ×1 IMPLANT
GOWN STRL REUS W/TWL LRG LVL3 (GOWN DISPOSABLE) ×4
GOWN STRL REUS W/TWL XL LVL3 (GOWN DISPOSABLE) ×2
HANDPIECE INTERPULSE COAX TIP (DISPOSABLE)
IV NS IRRIG 3000ML ARTHROMATIC (IV SOLUTION) ×2 IMPLANT
KIT BASIN OR (CUSTOM PROCEDURE TRAY) ×2 IMPLANT
KIT TURNOVER KIT B (KITS) ×2 IMPLANT
NS IRRIG 1000ML POUR BTL (IV SOLUTION) ×2 IMPLANT
PACK CV ACCESS (CUSTOM PROCEDURE TRAY) IMPLANT
PACK GENERAL/GYN (CUSTOM PROCEDURE TRAY) IMPLANT
PACK UNIVERSAL I (CUSTOM PROCEDURE TRAY) ×2 IMPLANT
PAD ARMBOARD 7.5X6 YLW CONV (MISCELLANEOUS) ×4 IMPLANT
PULSAVAC PLUS IRRIG FAN TIP (DISPOSABLE)
SET HNDPC FAN SPRY TIP SCT (DISPOSABLE) IMPLANT
SUT ETHILON 3 0 PS 1 (SUTURE) IMPLANT
SUT VIC AB 2-0 CTX 36 (SUTURE) IMPLANT
SUT VIC AB 3-0 SH 27 (SUTURE)
SUT VIC AB 3-0 SH 27X BRD (SUTURE) IMPLANT
SUT VICRYL 4-0 PS2 18IN ABS (SUTURE) IMPLANT
TIP FAN IRRIG PULSAVAC PLUS (DISPOSABLE) IMPLANT
TOWEL GREEN STERILE (TOWEL DISPOSABLE) ×2 IMPLANT
WATER STERILE IRR 1000ML POUR (IV SOLUTION) ×2 IMPLANT

## 2021-05-10 NOTE — Transfer of Care (Signed)
Immediate Anesthesia Transfer of Care Note  Patient: Derek Rollins  Procedure(s) Performed: IRRIGATION AND DEBRIDEMENT OF RIGHT LOWER EXTREMITY MEDIAL AND LATERAL FASCIOTOMIES. (Right: Leg Lower)  Patient Location: PACU  Anesthesia Type:General  Level of Consciousness: awake, alert  and oriented  Airway & Oxygen Therapy: Patient Spontanous Breathing and Patient connected to face mask oxygen  Post-op Assessment: Report given to RN and Post -op Vital signs reviewed and stable  Post vital signs: Reviewed and stable  Last Vitals:  Vitals Value Taken Time  BP 170/106 05/10/21 0838  Temp    Pulse 90 05/10/21 0840  Resp 19 05/10/21 0840  SpO2 100 % 05/10/21 0840  Vitals shown include unvalidated device data.  Last Pain:  Vitals:   05/10/21 0618  TempSrc:   PainSc: 5       Patients Stated Pain Goal: 3 (05/10/21 0618)  Complications: No notable events documented.

## 2021-05-10 NOTE — Op Note (Signed)
Date: May 10, 2021  Preoperative diagnosis: Necrotic right lower extremity fasciotomy incisions after right lower extremity thrombectomy with fasciotomies for acute limb ischemia  Postoperative diagnosis: Same  Procedure: Debridement of bilateral right lower extremity fasciotomy incisions including skin and and subcutaneous tissue and fascia  Surgeon: Dr. [name], MD  Assistant: OR staff  Indications: Patient is a 65 year old male who underwent right lower extremity thrombectomy via transfemoral approach and right lower extremity 4 compartment fasciotomies for acute right lower extremity limb ischemia on April 15, 2021.  Presented to the clinic yesterday with necrotic fasciotomy incisions and presents today for debridement after risk benefits discussed.  Findings: Right medial fasciotomy was debrided and all this with superficial eschar.  The right lateral fasciotomy was debrided and this included skin, subcutaneous tissue, and muscle fascia and the wound measured 10 cm x 4 cm x 1 cm deep.  The medial fasciotomy was dressed with Xeroform and the lateral fasciotomy is packed with a wet-to-dry Kerlix and then the wound was wrapped with a dry Kerlix and Ace.  Anesthesia: LMA  Details: Patient was taken to the operating room after informed consent was obtained.  Placed on the operative table in supine position.  After anesthesia was induced timeout was performed.  Antibiotics were given.  I initially removed all the staples from both the medial and lateral fasciotomy with hemostat from the right lower extremity.  I initially scrubbed both fasciotomies with chlorhexidine scrub brush to get all the superficial eschar off the wound.  The medial fasciotomy was all superficial eschar and the underlying skin and subcutaneous tissue appeared viable.  The lateral fasciotomy had a large eschar that extended down to the subcutaneous tissue and fascia of the muscle and all this was debrided  sharply with Metzenbaum scissors.  The wound measured 10 cm x 4 cm x 1 cm.  Both wounds were irrigated until the effluent was clear.  I placed Xeroform on the medial incision and a wet-to-dry Kerlix on the lateral incision and then the wounds were wrapped with dry Kerlix and Ace.  Tolerated taken to holding in stable condition.  Complication: None  Condition: Stable  [name], MD [facility] Office: [phone]   [name]

## 2021-05-10 NOTE — Anesthesia Procedure Notes (Signed)
Procedure Name: LMA Insertion Date/Time: 05/10/2021 8:12 AM Performed by: Mayer Camel, CRNA Pre-anesthesia Checklist: Patient identified, Emergency Drugs available, Suction available and Patient being monitored Patient Re-evaluated:Patient Re-evaluated prior to induction Oxygen Delivery Method: Circle System Utilized Preoxygenation: Pre-oxygenation with 100% oxygen Induction Type: IV induction Ventilation: Mask ventilation without difficulty LMA: LMA inserted LMA Size: 4.0 Number of attempts: 1 Airway Equipment and Method: Bite block Placement Confirmation: positive ETCO2 Tube secured with: Tape Dental Injury: Teeth and Oropharynx as per pre-operative assessment

## 2021-05-10 NOTE — Discharge Instructions (Signed)
Right LE wounds inside of leg wet to dry dressing changes once a day will try and order home health RN.

## 2021-05-10 NOTE — H&P (Signed)
History and Physical Interval Note:  05/10/2021 7:38 AM  Derek Rollins  has presented today for surgery, with the diagnosis of non healing surgical wound.  The various methods of treatment have been discussed with the patient and family. After consideration of risks, benefits and other options for treatment, the patient has consented to  Procedure(s): RIGHT LEG IRRIGATION AND DEBRIDEMENT WOUND (Right) as a surgical intervention.  The patient's history has been reviewed, patient examined, no change in status, stable for surgery.  I have reviewed the patient's chart and labs.  Questions were answered to the patient's satisfaction.     [name]  Office Note        CC:  follow up Requesting Provider:  Julieanne Manson, MD   HPI: Derek Rollins is a 65 y.o. (August 11, 1955) male who presents for postoperative follow-up status post right lower extremity thrombectomy via transfemoral approach as well as 4 compartment fasciotomy by Dr. Chestine Spore on 04/15/2021 due to acute limb ischemia.  Embolic work-up postoperatively was negative.  He was discharged home on Xarelto.  He is seen today using a Nurse, learning disability.  He states his right groin incision is well-healed however he is worried about his fasciotomy incisions.  He denies fevers, chills, nausea/vomiting.  He states he has not been taking his Xarelto.  He continues to smoke daily.         Past Medical History:  Diagnosis Date   Arthritis     Hypertension             Past Surgical History:  Procedure Laterality Date   FASCIOTOMY Right 04/15/2021    Procedure: FOUR COMPARTMENT FASCIOTOMY;  Surgeon: [name], MD;  Location: [facility] OR;  Service: Vascular;  Laterality: Right;   LUMBAR DISC SURGERY   1973   LUMBAR LAMINECTOMY/DECOMPRESSION MICRODISCECTOMY Left 09/24/2014    Procedure: CENTRAL DECOMPRESSION  LUMBAR LAMENECTOMYL 4-L5, FORAMENOTOMY L 4-5 ROOT FOR FORAMIAL STENOSIS, EXCISION OF SCAR TISSUE L 4-5;  Surgeon: Ranee Gosselin, MD;   Location: [facility] ORS;  Service: Orthopedics;  Laterality: Left;   THROMBECTOMY OF BYPASS GRAFT FEMORAL- TIBIAL ARTERY Right 04/15/2021    Procedure: RIGHT LOWER EXTREMITY THROMBECTOMY;  Surgeon: [name], MD;  Location: [facility];  Service: Vascular;  Laterality: Right;   TOTAL HIP ARTHROPLASTY Right 06/28/2014    Procedure: RIGHT TOTAL HIP ARTHROPLASTY;  Surgeon: Jacki Cones, MD;  Location: [facility] ORS;  Service: Orthopedics;  Laterality: Right;      Social History         Socioeconomic History   Marital status: Married      Spouse name: Separated   Number of children: 2   Years of education: 12   Highest education level: Not on file  Occupational History   Occupation: Unemployed       Comment: Working on achieving disability  Tobacco Use   Smoking status: Every Day      Packs/day: 0.50      Years: 24.00      Pack years: 12.00      Types: Cigarettes   Smokeless tobacco: Never   Tobacco comments:      Did call quit line with a student helping, but for some reason thought he was instructed to call 911.  Discussed that is a message only to call if having an emergency and needs to listen to entire message next time.  Interpreter stated she would help him  Substance and Sexual Activity   Alcohol use: Yes  Alcohol/week: 0.0 standard drinks      Comment: Occasional  on weekends--maybe 6 beers once a month  and karaoke with friends   Drug use: No   Sexual activity: Not on file  Other Topics Concern   Not on file  Social History Narrative    Born and raised in [place].    Is now separated from his wife and lives alone.    Has been unable to support his two sons, so they live with his brother in [place]    Fun: Can't do much now - sing karoke.    Social Determinants of Health    Financial Resource Strain: Not on file  Food Insecurity: Not on file  Transportation Needs: Not on file  Physical Activity: Not on file  Stress: Not on file  Social Connections: Not on file   Intimate Partner Violence: Not on file    History reviewed. No pertinent family history.         Current Outpatient Medications  Medication Sig Dispense Refill   oxyCODONE-acetaminophen (PERCOCET/ROXICET) 5-325 MG tablet Take 1 tablet by mouth every 6 (six) hours as needed for moderate pain. 20 tablet 0   rosuvastatin (CRESTOR) 20 MG tablet Take 1 tablet (20 mg total) by mouth daily. 30 tablet 1   amLODipine (NORVASC) 10 MG tablet Take 1 tablet (10 mg total) by mouth daily. (Patient not taking: Reported on 04/05/2017) 30 tablet 11   meloxicam (MOBIC) 7.5 MG tablet 1 tab by mouth daily with meal as needed for pain (Patient not taking: Reported on 04/16/2021) 30 tablet 6   RIVAROXABAN (XARELTO) VTE STARTER PACK (15 & 20 MG) Follow package directions: Take one 15mg  tablet by mouth twice a day. On day 22, switch to one 20mg  tablet once a day. Take with food. (Patient not taking: Reported on 05/09/2021) 51 each 0    No current facility-administered medications for this visit.           Allergies  Allergen Reactions   Aspirin        Face and mouth numbness         REVIEW OF SYSTEMS:    [X]  denotes positive finding, [ ]  denotes negative finding Cardiac   Comments:  Chest pain or chest pressure:      Shortness of breath upon exertion:      Short of breath when lying flat:      Irregular heart rhythm:             Vascular      Pain in calf, thigh, or hip brought on by ambulation:      Pain in feet at night that wakes you up from your sleep:       Blood clot in your veins:      Leg swelling:              Pulmonary      Oxygen at home:      Productive cough:       Wheezing:              Neurologic      Sudden weakness in arms or legs:       Sudden numbness in arms or legs:       Sudden onset of difficulty speaking or slurred speech:      Temporary loss of vision in one eye:       Problems with dizziness:  Gastrointestinal      Blood in stool:       Vomited  blood:              Genitourinary      Burning when urinating:       Blood in urine:             Psychiatric      Major depression:              Hematologic      Bleeding problems:      Problems with blood clotting too easily:             Skin      Rashes or ulcers:             Constitutional      Fever or chills:          PHYSICAL EXAMINATION:      Vitals:    05/09/21 1115  BP: (!) 177/106  Pulse: 85  Resp: 20  Temp: 98 F (36.7 C)  TempSrc: Temporal  SpO2: 98%  Weight: 107 lb 3.2 oz (48.6 kg)  Height: 5\' 1"  (1.549 m)      General:  WDWN in NAD; vital signs documented above Gait: Not observed HENT: WNL, normocephalic Pulmonary: normal non-labored breathing Cardiac: regular HR, without  Murmurs  Abdomen: soft, NT, no masses Skin: without rashes Vascular Exam/Pulses:   Right Left  Radial 2+ (normal) 2+ (normal)  DP 2+ (normal) 2+ (normal)  PT 2+ (normal) 2+ (normal)    Extremities: Right groin incision well-healed; right medial fasciotomy incision with erythema however no drainage; right lateral fasciotomy incision with skin necrosis and surrounding erythema Musculoskeletal: no muscle wasting or atrophy       Neurologic: A&O X 3;  No focal weakness or paresthesias are detected Psychiatric:  The pt has Normal affect.       ASSESSMENT/PLAN:: 65 y.o. male status post right lower extremity thrombectomy and 4 compartment fasciotomy due to acute limb ischemia   -Right lower extremity is well-perfused with easily palpable DP and PT pulses -Right groin incision is well-healed -Unfortunately fasciotomy incisions are not healing well, especially lateral incision.  He has skin necrosis with surrounding erythema.  Plan will be to return to the operating room tomorrow with Dr. 77 for at least a lateral fasciotomy debridement possible medial fasciotomy debridement.  We will also make sure he has a prescription for Xarelto at discharge. -Translator was used for today's  visit including explaining the nature of the above procedure and he agrees to proceed     Chestine Spore, PA-C [facility] [phone]   Clinic MD:   [phone]

## 2021-05-10 NOTE — Anesthesia Procedure Notes (Signed)
Procedure Name: MAC Date/Time: 05/10/2021 7:51 AM Performed by: Carolan Clines, CRNA Pre-anesthesia Checklist: Patient identified, Emergency Drugs available, Suction available, Patient being monitored and Timeout performed Patient Re-evaluated:Patient Re-evaluated prior to induction Oxygen Delivery Method: Simple face mask Dental Injury: Teeth and Oropharynx as per pre-operative assessment

## 2021-05-11 ENCOUNTER — Encounter (HOSPITAL_COMMUNITY): Payer: Self-pay | Admitting: Vascular Surgery

## 2021-05-11 ENCOUNTER — Other Ambulatory Visit: Payer: Self-pay

## 2021-05-11 DIAGNOSIS — I998 Other disorder of circulatory system: Secondary | ICD-10-CM

## 2021-05-11 NOTE — Anesthesia Postprocedure Evaluation (Signed)
Anesthesia Post Note  Patient: Derek Rollins  Procedure(s) Performed: IRRIGATION AND DEBRIDEMENT OF RIGHT LOWER EXTREMITY MEDIAL AND LATERAL FASCIOTOMIES. (Right: Leg Lower)     Patient location during evaluation: PACU Anesthesia Type: General Level of consciousness: sedated and patient cooperative Pain management: pain level controlled Vital Signs Assessment: post-procedure vital signs reviewed and stable Respiratory status: spontaneous breathing Cardiovascular status: stable Anesthetic complications: no   No notable events documented.  Last Vitals:  Vitals:   05/10/21 0853 05/10/21 0909  BP: (!) 157/110 (!) 166/96  Pulse: 85 86  Resp: 16 17  Temp:    SpO2: 100% 100%    Last Pain:  Vitals:   05/10/21 0838  TempSrc:   PainSc: 5                  [name]

## 2023-07-11 IMAGING — CT CT ANGIO AOBIFEM WO/W CM
1 of 7 series · 3 of 16 positions shown, 4 images · IV contrast (APPLIED)
Comparison: None.

CLINICAL DATA: Claudication or leg ischemia.

EXAM:
CT ANGIOGRAPHY OF ABDOMINAL AORTA WITH ILIOFEMORAL RUNOFF
TECHNIQUE: Multidetector CT imaging of the abdomen, pelvis and lower
extremities was performed using the standard protocol during bolus
administration of intravenous contrast. Multiplanar CT image
reconstructions and MIPs were obtained to evaluate the vascular
anatomy.
CONTRAST:  100mL OMNIPAQUE IOHEXOL 350 MG/ML SOLN

[Series 5: arterial · axial · arterial · 0.74mm/px · z∈[+263,+903]mm · 3 of 640 slices shown, 4 images]
[im 160/640  soft-tissue]
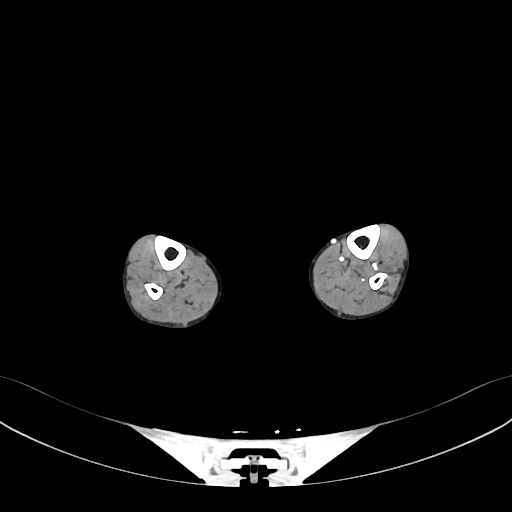
[im 160/640  bone]
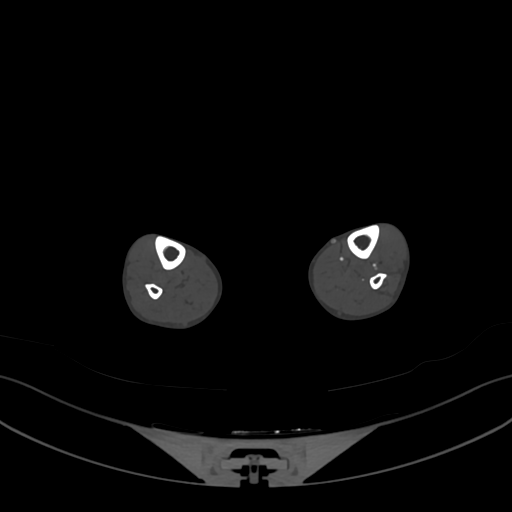
[im 320/640  soft-tissue]
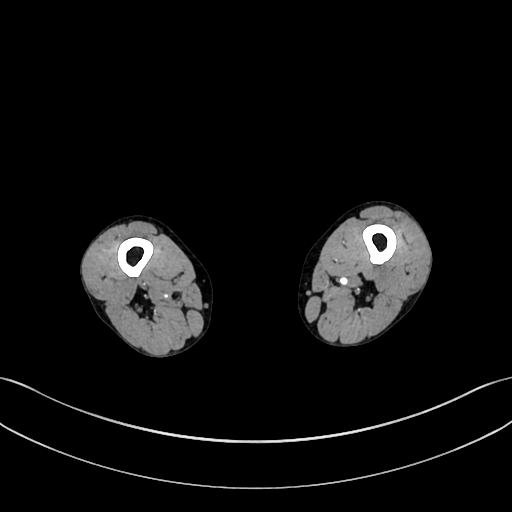
[im 480/640  soft-tissue]
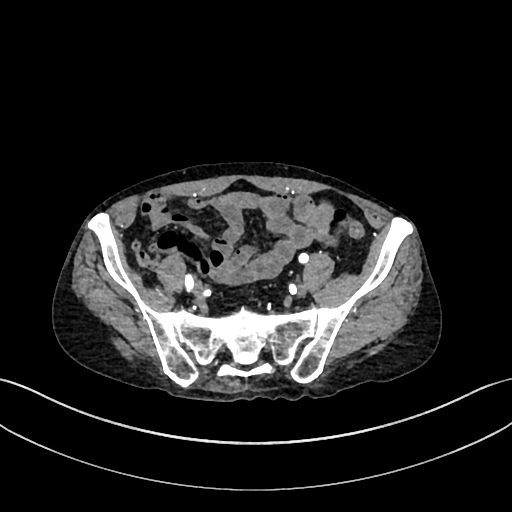

[3 of 16 positions shown; findings below may reference images not displayed]

FINDINGS: VASCULAR

Aorta: Normal caliber aorta without aneurysm, dissection, vasculitis
or significant stenosis.

Celiac: Patent without evidence of aneurysm, dissection, vasculitis
or significant stenosis.

SMA: Patent without evidence of aneurysm, dissection, vasculitis or
significant stenosis.

Renals: Both renal arteries are patent without evidence of aneurysm,
dissection, vasculitis, fibromuscular dysplasia or significant
stenosis.

IMA: Patent without evidence of aneurysm, dissection, vasculitis or
significant stenosis.

RIGHT Lower Extremity

Inflow: Common, internal and external iliac arteries are patent.

Outflow: Complete occlusion of the right femoral artery, at its
takeoff. The profunda femoral artery is opacified. Minimal
reconstitution of flow at the level of the mid femoral artery and
popliteal artery. Anterior tibial artery, posterior tibial artery,
peroneal artery are completely occluded throughout their courses.

LEFT Lower Extremity

Inflow: Common, internal and external iliac arteries are patent
without evidence of aneurysm, dissection, vasculitis or significant
stenosis.

Outflow: Common, superficial and profunda femoral arteries and the
popliteal artery are patent without evidence of aneurysm,
dissection, vasculitis or significant stenosis.

Runoff: Patent three vessel runoff to the ankle.

Veins: No obvious venous abnormality within the limitations of this
arterial phase study.

Review of the MIP images confirms the above findings.

NON-VASCULAR

Lower chest: No acute abnormality.

Hepatobiliary: No focal liver abnormality is seen. No gallstones,
gallbladder wall thickening, or biliary dilatation.

Pancreas: Unremarkable. No pancreatic ductal dilatation or
surrounding inflammatory changes.

Spleen: Normal in size without focal abnormality.

Adrenals/Urinary Tract: Adrenal glands are unremarkable. Kidneys are
normal, without renal calculi, focal lesion, or hydronephrosis.
Bladder is unremarkable.

Stomach/Bowel: Stomach is within normal limits. No evidence of
appendicitis. No evidence of bowel wall thickening, distention, or
inflammatory changes.

Lymphatic: Aortic atherosclerosis. No enlarged abdominal or pelvic
lymph nodes.

Reproductive: Prostate is unremarkable.

Other: No abdominal wall hernia or abnormality. No abdominopelvic
ascites.

Musculoskeletal: Total right hip arthroplasty.  No acute findings.
IMPRESSION: 1. Complete occlusion of the right femoral artery, at its takeoff,
with minimal reconstitution of flow at the level of the mid femoral
artery and popliteal artery.
2. Complete occlusion of the right anterior tibial, posterior
tibial, and peroneal arteries throughout their courses.
3. Patent left lower extremity arterial vasculature.
4. Patent three-vessel runoff to the left ankle.
5. No evidence of acute abnormalities within the abdomen or pelvis.
6. Aortic atherosclerosis and atherosclerosis of the named arteries
of the lower extremities.

Aortic Atherosclerosis ([id number]-[id number]).

These results were called by telephone at the time of interpretation
on 04/15/2021 at [DATE] p.m. to provider UNBREAKABLE KOH , who verbally
acknowledged these results.
